# Patient Record
Sex: Male | Born: 1945
Health system: Southern US, Community
[De-identification: ages and names within clinical notes are randomized; demographics above are authoritative.]

## PROBLEM LIST (undated history)

## (undated) DIAGNOSIS — M4802 Spinal stenosis, cervical region: Secondary | ICD-10-CM

## (undated) DIAGNOSIS — C439 Malignant melanoma of skin, unspecified: Secondary | ICD-10-CM

## (undated) DIAGNOSIS — S93409A Sprain of unspecified ligament of unspecified ankle, initial encounter: Secondary | ICD-10-CM

## (undated) DIAGNOSIS — Z86010 Personal history of colonic polyps: Secondary | ICD-10-CM

## (undated) DIAGNOSIS — C189 Malignant neoplasm of colon, unspecified: Secondary | ICD-10-CM

## (undated) DIAGNOSIS — Z860101 Personal history of adenomatous and serrated colon polyps: Secondary | ICD-10-CM

## (undated) DIAGNOSIS — F419 Anxiety disorder, unspecified: Secondary | ICD-10-CM

## (undated) DIAGNOSIS — I10 Essential (primary) hypertension: Secondary | ICD-10-CM

## (undated) DIAGNOSIS — I4892 Unspecified atrial flutter: Secondary | ICD-10-CM

## (undated) DIAGNOSIS — E785 Hyperlipidemia, unspecified: Secondary | ICD-10-CM

## (undated) DIAGNOSIS — K219 Gastro-esophageal reflux disease without esophagitis: Secondary | ICD-10-CM

## (undated) DIAGNOSIS — H01009 Unspecified blepharitis unspecified eye, unspecified eyelid: Secondary | ICD-10-CM

## (undated) DIAGNOSIS — B029 Zoster without complications: Secondary | ICD-10-CM

## (undated) HISTORY — DX: Personal history of colonic polyps: Z86.010

## (undated) HISTORY — DX: Personal history of adenomatous and serrated colon polyps: Z86.0101

## (undated) HISTORY — DX: Anxiety disorder, unspecified: F41.9

## (undated) HISTORY — DX: Unspecified atrial flutter: I48.92

## (undated) HISTORY — DX: Sprain of unspecified ligament of unspecified ankle, initial encounter: S93.409A

## (undated) HISTORY — PX: APPENDECTOMY: SHX54

## (undated) HISTORY — PX: OTHER SURGICAL HISTORY: SHX169

## (undated) HISTORY — DX: Essential (primary) hypertension: I10

## (undated) HISTORY — DX: Malignant melanoma of skin, unspecified: C43.9

## (undated) HISTORY — DX: Malignant neoplasm of colon, unspecified: C18.9

## (undated) HISTORY — PX: ANKLE ARTHROSCOPY: SUR85

## (undated) HISTORY — DX: Zoster without complications: B02.9

## (undated) HISTORY — DX: Spinal stenosis, cervical region: M48.02

## (undated) HISTORY — PX: COLONOSCOPY: SHX174

## (undated) HISTORY — DX: Hyperlipidemia, unspecified: E78.5

## (undated) HISTORY — DX: Unspecified blepharitis unspecified eye, unspecified eyelid: H01.009

## (undated) HISTORY — PX: INGUINAL HERNIA REPAIR: SUR1180

## (undated) HISTORY — DX: Gastro-esophageal reflux disease without esophagitis: K21.9

---

## 1999-01-19 ENCOUNTER — Ambulatory Visit (HOSPITAL_BASED_OUTPATIENT_CLINIC_OR_DEPARTMENT_OTHER): Admission: RE | Admit: 1999-01-19 | Discharge: 1999-01-19 | Payer: Self-pay | Admitting: Orthopedic Surgery

## 2001-07-25 ENCOUNTER — Encounter: Payer: Self-pay | Admitting: Internal Medicine

## 2001-07-25 ENCOUNTER — Encounter: Admission: RE | Admit: 2001-07-25 | Discharge: 2001-07-25 | Payer: Self-pay | Admitting: Internal Medicine

## 2002-07-17 ENCOUNTER — Encounter: Admission: RE | Admit: 2002-07-17 | Discharge: 2002-07-17 | Payer: Self-pay | Admitting: Internal Medicine

## 2002-07-17 ENCOUNTER — Encounter: Payer: Self-pay | Admitting: Internal Medicine

## 2005-01-17 ENCOUNTER — Ambulatory Visit (HOSPITAL_COMMUNITY): Admission: RE | Admit: 2005-01-17 | Discharge: 2005-01-17 | Payer: Self-pay | Admitting: Gastroenterology

## 2005-02-06 ENCOUNTER — Ambulatory Visit: Payer: Self-pay | Admitting: Internal Medicine

## 2005-02-27 ENCOUNTER — Ambulatory Visit: Payer: Self-pay | Admitting: Internal Medicine

## 2005-03-05 ENCOUNTER — Ambulatory Visit: Payer: Self-pay | Admitting: Cardiology

## 2005-03-06 ENCOUNTER — Ambulatory Visit (HOSPITAL_COMMUNITY): Admission: RE | Admit: 2005-03-06 | Discharge: 2005-03-07 | Payer: Self-pay | Admitting: Internal Medicine

## 2005-03-06 ENCOUNTER — Ambulatory Visit: Payer: Self-pay | Admitting: Internal Medicine

## 2005-04-20 ENCOUNTER — Ambulatory Visit: Payer: Self-pay | Admitting: Internal Medicine

## 2010-12-06 ENCOUNTER — Other Ambulatory Visit: Payer: Self-pay | Admitting: Internal Medicine

## 2010-12-06 DIAGNOSIS — R070 Pain in throat: Secondary | ICD-10-CM

## 2010-12-07 ENCOUNTER — Ambulatory Visit
Admission: RE | Admit: 2010-12-07 | Discharge: 2010-12-07 | Disposition: A | Discharge status: Home / Self Care | Payer: BC Managed Care – PPO | Source: Ambulatory Visit | Attending: Internal Medicine | Admitting: Internal Medicine

## 2010-12-07 DIAGNOSIS — R070 Pain in throat: Secondary | ICD-10-CM

## 2010-12-07 MED ORDER — IOHEXOL 300 MG/ML  SOLN
75.0000 mL | Freq: Once | INTRAMUSCULAR | Status: AC | PRN
  Administered 2010-12-07: 75 mL via INTRAVENOUS

## 2011-11-27 ENCOUNTER — Other Ambulatory Visit: Payer: Self-pay | Admitting: Internal Medicine

## 2011-11-27 ENCOUNTER — Ambulatory Visit
Admission: RE | Admit: 2011-11-27 | Discharge: 2011-11-27 | Disposition: A | Discharge status: Home / Self Care | Payer: BC Managed Care – PPO | Source: Ambulatory Visit | Attending: Internal Medicine | Admitting: Internal Medicine

## 2011-11-27 DIAGNOSIS — S0990XA Unspecified injury of head, initial encounter: Secondary | ICD-10-CM

## 2012-06-26 DIAGNOSIS — G518 Other disorders of facial nerve: Secondary | ICD-10-CM | POA: Diagnosis not present

## 2012-07-07 DIAGNOSIS — R109 Unspecified abdominal pain: Secondary | ICD-10-CM | POA: Diagnosis not present

## 2012-09-24 DIAGNOSIS — F4324 Adjustment disorder with disturbance of conduct: Secondary | ICD-10-CM | POA: Diagnosis not present

## 2012-09-29 DIAGNOSIS — F4324 Adjustment disorder with disturbance of conduct: Secondary | ICD-10-CM | POA: Diagnosis not present

## 2012-10-01 DIAGNOSIS — B029 Zoster without complications: Secondary | ICD-10-CM | POA: Diagnosis not present

## 2012-10-02 DIAGNOSIS — B0239 Other herpes zoster eye disease: Secondary | ICD-10-CM | POA: Diagnosis not present

## 2012-10-10 DIAGNOSIS — B0239 Other herpes zoster eye disease: Secondary | ICD-10-CM | POA: Diagnosis not present

## 2012-10-10 DIAGNOSIS — H5210 Myopia, unspecified eye: Secondary | ICD-10-CM | POA: Diagnosis not present

## 2012-10-10 DIAGNOSIS — B029 Zoster without complications: Secondary | ICD-10-CM | POA: Diagnosis not present

## 2012-10-10 DIAGNOSIS — B0223 Postherpetic polyneuropathy: Secondary | ICD-10-CM | POA: Diagnosis not present

## 2012-10-16 DIAGNOSIS — Z85828 Personal history of other malignant neoplasm of skin: Secondary | ICD-10-CM | POA: Diagnosis not present

## 2012-10-16 DIAGNOSIS — B0223 Postherpetic polyneuropathy: Secondary | ICD-10-CM | POA: Diagnosis not present

## 2012-10-16 DIAGNOSIS — Z85820 Personal history of malignant melanoma of skin: Secondary | ICD-10-CM | POA: Diagnosis not present

## 2012-10-16 DIAGNOSIS — L570 Actinic keratosis: Secondary | ICD-10-CM | POA: Diagnosis not present

## 2012-10-20 DIAGNOSIS — F4324 Adjustment disorder with disturbance of conduct: Secondary | ICD-10-CM | POA: Diagnosis not present

## 2012-10-23 DIAGNOSIS — F4320 Adjustment disorder, unspecified: Secondary | ICD-10-CM | POA: Diagnosis not present

## 2012-10-23 DIAGNOSIS — F411 Generalized anxiety disorder: Secondary | ICD-10-CM | POA: Diagnosis not present

## 2012-10-24 DIAGNOSIS — B0223 Postherpetic polyneuropathy: Secondary | ICD-10-CM | POA: Diagnosis not present

## 2012-10-24 DIAGNOSIS — B0229 Other postherpetic nervous system involvement: Secondary | ICD-10-CM | POA: Diagnosis not present

## 2012-10-27 DIAGNOSIS — F4324 Adjustment disorder with disturbance of conduct: Secondary | ICD-10-CM | POA: Diagnosis not present

## 2012-11-04 DIAGNOSIS — F4324 Adjustment disorder with disturbance of conduct: Secondary | ICD-10-CM | POA: Diagnosis not present

## 2012-11-10 DIAGNOSIS — F4324 Adjustment disorder with disturbance of conduct: Secondary | ICD-10-CM | POA: Diagnosis not present

## 2012-11-10 DIAGNOSIS — F411 Generalized anxiety disorder: Secondary | ICD-10-CM | POA: Diagnosis not present

## 2012-11-12 DIAGNOSIS — F4324 Adjustment disorder with disturbance of conduct: Secondary | ICD-10-CM | POA: Diagnosis not present

## 2012-11-13 DIAGNOSIS — G518 Other disorders of facial nerve: Secondary | ICD-10-CM | POA: Diagnosis not present

## 2012-11-24 DIAGNOSIS — F411 Generalized anxiety disorder: Secondary | ICD-10-CM | POA: Diagnosis not present

## 2012-11-24 DIAGNOSIS — F4324 Adjustment disorder with disturbance of conduct: Secondary | ICD-10-CM | POA: Diagnosis not present

## 2012-12-15 DIAGNOSIS — F4324 Adjustment disorder with disturbance of conduct: Secondary | ICD-10-CM | POA: Diagnosis not present

## 2012-12-15 DIAGNOSIS — F411 Generalized anxiety disorder: Secondary | ICD-10-CM | POA: Diagnosis not present

## 2012-12-22 DIAGNOSIS — F4324 Adjustment disorder with disturbance of conduct: Secondary | ICD-10-CM | POA: Diagnosis not present

## 2012-12-22 DIAGNOSIS — F411 Generalized anxiety disorder: Secondary | ICD-10-CM | POA: Diagnosis not present

## 2013-01-14 DIAGNOSIS — F4324 Adjustment disorder with disturbance of conduct: Secondary | ICD-10-CM | POA: Diagnosis not present

## 2013-01-14 DIAGNOSIS — F411 Generalized anxiety disorder: Secondary | ICD-10-CM | POA: Diagnosis not present

## 2013-01-26 DIAGNOSIS — H6980 Other specified disorders of Eustachian tube, unspecified ear: Secondary | ICD-10-CM | POA: Diagnosis not present

## 2013-01-26 DIAGNOSIS — B0229 Other postherpetic nervous system involvement: Secondary | ICD-10-CM | POA: Diagnosis not present

## 2013-02-05 DIAGNOSIS — F411 Generalized anxiety disorder: Secondary | ICD-10-CM | POA: Diagnosis not present

## 2013-02-05 DIAGNOSIS — F4324 Adjustment disorder with disturbance of conduct: Secondary | ICD-10-CM | POA: Diagnosis not present

## 2013-02-27 DIAGNOSIS — G479 Sleep disorder, unspecified: Secondary | ICD-10-CM | POA: Diagnosis not present

## 2013-02-27 DIAGNOSIS — Z125 Encounter for screening for malignant neoplasm of prostate: Secondary | ICD-10-CM | POA: Diagnosis not present

## 2013-02-27 DIAGNOSIS — Z23 Encounter for immunization: Secondary | ICD-10-CM | POA: Diagnosis not present

## 2013-02-27 DIAGNOSIS — I4892 Unspecified atrial flutter: Secondary | ICD-10-CM | POA: Diagnosis not present

## 2013-02-27 DIAGNOSIS — K219 Gastro-esophageal reflux disease without esophagitis: Secondary | ICD-10-CM | POA: Diagnosis not present

## 2013-02-27 DIAGNOSIS — G5600 Carpal tunnel syndrome, unspecified upper limb: Secondary | ICD-10-CM | POA: Diagnosis not present

## 2013-02-27 DIAGNOSIS — Z0000 Encounter for general adult medical examination without abnormal findings: Secondary | ICD-10-CM | POA: Diagnosis not present

## 2013-02-27 DIAGNOSIS — F411 Generalized anxiety disorder: Secondary | ICD-10-CM | POA: Diagnosis not present

## 2013-02-27 DIAGNOSIS — I10 Essential (primary) hypertension: Secondary | ICD-10-CM | POA: Diagnosis not present

## 2013-03-12 DIAGNOSIS — R209 Unspecified disturbances of skin sensation: Secondary | ICD-10-CM | POA: Diagnosis not present

## 2013-03-12 DIAGNOSIS — G518 Other disorders of facial nerve: Secondary | ICD-10-CM | POA: Diagnosis not present

## 2013-03-24 DIAGNOSIS — H579 Unspecified disorder of eye and adnexa: Secondary | ICD-10-CM | POA: Diagnosis not present

## 2013-04-07 DIAGNOSIS — F4324 Adjustment disorder with disturbance of conduct: Secondary | ICD-10-CM | POA: Diagnosis not present

## 2013-04-07 DIAGNOSIS — F411 Generalized anxiety disorder: Secondary | ICD-10-CM | POA: Diagnosis not present

## 2013-04-16 DIAGNOSIS — H11429 Conjunctival edema, unspecified eye: Secondary | ICD-10-CM | POA: Diagnosis not present

## 2013-04-16 DIAGNOSIS — H01009 Unspecified blepharitis unspecified eye, unspecified eyelid: Secondary | ICD-10-CM | POA: Diagnosis not present

## 2013-05-01 DIAGNOSIS — F4324 Adjustment disorder with disturbance of conduct: Secondary | ICD-10-CM | POA: Diagnosis not present

## 2013-05-01 DIAGNOSIS — F411 Generalized anxiety disorder: Secondary | ICD-10-CM | POA: Diagnosis not present

## 2013-05-04 DIAGNOSIS — F4324 Adjustment disorder with disturbance of conduct: Secondary | ICD-10-CM | POA: Diagnosis not present

## 2013-05-04 DIAGNOSIS — N529 Male erectile dysfunction, unspecified: Secondary | ICD-10-CM | POA: Diagnosis not present

## 2013-05-04 DIAGNOSIS — N400 Enlarged prostate without lower urinary tract symptoms: Secondary | ICD-10-CM | POA: Diagnosis not present

## 2013-05-04 DIAGNOSIS — R972 Elevated prostate specific antigen [PSA]: Secondary | ICD-10-CM | POA: Diagnosis not present

## 2013-05-04 DIAGNOSIS — F411 Generalized anxiety disorder: Secondary | ICD-10-CM | POA: Diagnosis not present

## 2013-05-13 DIAGNOSIS — F4324 Adjustment disorder with disturbance of conduct: Secondary | ICD-10-CM | POA: Diagnosis not present

## 2013-05-13 DIAGNOSIS — F411 Generalized anxiety disorder: Secondary | ICD-10-CM | POA: Diagnosis not present

## 2013-05-13 DIAGNOSIS — H01009 Unspecified blepharitis unspecified eye, unspecified eyelid: Secondary | ICD-10-CM | POA: Diagnosis not present

## 2013-06-24 ENCOUNTER — Encounter: Payer: Self-pay | Admitting: *Deleted

## 2013-06-24 DIAGNOSIS — I10 Essential (primary) hypertension: Secondary | ICD-10-CM | POA: Insufficient documentation

## 2013-06-24 DIAGNOSIS — I4892 Unspecified atrial flutter: Secondary | ICD-10-CM | POA: Insufficient documentation

## 2013-06-30 DIAGNOSIS — F411 Generalized anxiety disorder: Secondary | ICD-10-CM | POA: Diagnosis not present

## 2013-06-30 DIAGNOSIS — F4324 Adjustment disorder with disturbance of conduct: Secondary | ICD-10-CM | POA: Diagnosis not present

## 2013-07-07 DIAGNOSIS — F988 Other specified behavioral and emotional disorders with onset usually occurring in childhood and adolescence: Secondary | ICD-10-CM | POA: Diagnosis not present

## 2013-07-07 DIAGNOSIS — F411 Generalized anxiety disorder: Secondary | ICD-10-CM | POA: Diagnosis not present

## 2013-07-09 DIAGNOSIS — D126 Benign neoplasm of colon, unspecified: Secondary | ICD-10-CM | POA: Diagnosis not present

## 2013-07-09 DIAGNOSIS — K5730 Diverticulosis of large intestine without perforation or abscess without bleeding: Secondary | ICD-10-CM | POA: Diagnosis not present

## 2013-07-09 DIAGNOSIS — Z1211 Encounter for screening for malignant neoplasm of colon: Secondary | ICD-10-CM | POA: Diagnosis not present

## 2013-07-16 DIAGNOSIS — G518 Other disorders of facial nerve: Secondary | ICD-10-CM | POA: Diagnosis not present

## 2013-08-05 DIAGNOSIS — F411 Generalized anxiety disorder: Secondary | ICD-10-CM | POA: Diagnosis not present

## 2013-08-05 DIAGNOSIS — F4324 Adjustment disorder with disturbance of conduct: Secondary | ICD-10-CM | POA: Diagnosis not present

## 2013-08-05 DIAGNOSIS — F988 Other specified behavioral and emotional disorders with onset usually occurring in childhood and adolescence: Secondary | ICD-10-CM | POA: Diagnosis not present

## 2013-08-18 DIAGNOSIS — K645 Perianal venous thrombosis: Secondary | ICD-10-CM | POA: Diagnosis not present

## 2013-08-18 DIAGNOSIS — I10 Essential (primary) hypertension: Secondary | ICD-10-CM | POA: Diagnosis not present

## 2013-08-25 DIAGNOSIS — F988 Other specified behavioral and emotional disorders with onset usually occurring in childhood and adolescence: Secondary | ICD-10-CM | POA: Diagnosis not present

## 2013-08-25 DIAGNOSIS — F4324 Adjustment disorder with disturbance of conduct: Secondary | ICD-10-CM | POA: Diagnosis not present

## 2013-08-25 DIAGNOSIS — F411 Generalized anxiety disorder: Secondary | ICD-10-CM | POA: Diagnosis not present

## 2013-10-22 DIAGNOSIS — Z87891 Personal history of nicotine dependence: Secondary | ICD-10-CM | POA: Diagnosis not present

## 2013-10-22 DIAGNOSIS — D485 Neoplasm of uncertain behavior of skin: Secondary | ICD-10-CM | POA: Diagnosis not present

## 2013-10-22 DIAGNOSIS — L821 Other seborrheic keratosis: Secondary | ICD-10-CM | POA: Diagnosis not present

## 2013-10-22 DIAGNOSIS — I10 Essential (primary) hypertension: Secondary | ICD-10-CM | POA: Diagnosis not present

## 2013-10-22 DIAGNOSIS — L578 Other skin changes due to chronic exposure to nonionizing radiation: Secondary | ICD-10-CM | POA: Diagnosis not present

## 2013-10-22 DIAGNOSIS — Z85820 Personal history of malignant melanoma of skin: Secondary | ICD-10-CM | POA: Diagnosis not present

## 2013-10-22 DIAGNOSIS — D1809 Hemangioma of other sites: Secondary | ICD-10-CM | POA: Diagnosis not present

## 2013-10-22 DIAGNOSIS — Z85828 Personal history of other malignant neoplasm of skin: Secondary | ICD-10-CM | POA: Diagnosis not present

## 2013-11-12 DIAGNOSIS — R002 Palpitations: Secondary | ICD-10-CM | POA: Diagnosis not present

## 2013-11-12 DIAGNOSIS — Z23 Encounter for immunization: Secondary | ICD-10-CM | POA: Diagnosis not present

## 2013-11-16 DIAGNOSIS — I10 Essential (primary) hypertension: Secondary | ICD-10-CM | POA: Diagnosis not present

## 2013-11-16 DIAGNOSIS — F419 Anxiety disorder, unspecified: Secondary | ICD-10-CM | POA: Diagnosis not present

## 2013-11-16 DIAGNOSIS — B0229 Other postherpetic nervous system involvement: Secondary | ICD-10-CM | POA: Diagnosis not present

## 2013-11-16 DIAGNOSIS — K219 Gastro-esophageal reflux disease without esophagitis: Secondary | ICD-10-CM | POA: Diagnosis not present

## 2014-01-12 DIAGNOSIS — F411 Generalized anxiety disorder: Secondary | ICD-10-CM | POA: Diagnosis not present

## 2014-01-12 DIAGNOSIS — F4324 Adjustment disorder with disturbance of conduct: Secondary | ICD-10-CM | POA: Diagnosis not present

## 2014-01-28 DIAGNOSIS — J0100 Acute maxillary sinusitis, unspecified: Secondary | ICD-10-CM | POA: Diagnosis not present

## 2014-01-28 DIAGNOSIS — Z7189 Other specified counseling: Secondary | ICD-10-CM | POA: Diagnosis not present

## 2014-02-09 DIAGNOSIS — F419 Anxiety disorder, unspecified: Secondary | ICD-10-CM | POA: Diagnosis not present

## 2014-02-09 DIAGNOSIS — K219 Gastro-esophageal reflux disease without esophagitis: Secondary | ICD-10-CM | POA: Diagnosis not present

## 2014-02-09 DIAGNOSIS — E559 Vitamin D deficiency, unspecified: Secondary | ICD-10-CM | POA: Diagnosis not present

## 2014-02-09 DIAGNOSIS — I10 Essential (primary) hypertension: Secondary | ICD-10-CM | POA: Diagnosis not present

## 2014-02-09 DIAGNOSIS — Z79899 Other long term (current) drug therapy: Secondary | ICD-10-CM | POA: Diagnosis not present

## 2014-02-09 DIAGNOSIS — Z0001 Encounter for general adult medical examination with abnormal findings: Secondary | ICD-10-CM | POA: Diagnosis not present

## 2014-02-09 DIAGNOSIS — B0229 Other postherpetic nervous system involvement: Secondary | ICD-10-CM | POA: Diagnosis not present

## 2014-02-09 DIAGNOSIS — J0100 Acute maxillary sinusitis, unspecified: Secondary | ICD-10-CM | POA: Diagnosis not present

## 2014-03-03 DIAGNOSIS — Z85828 Personal history of other malignant neoplasm of skin: Secondary | ICD-10-CM | POA: Diagnosis not present

## 2014-03-03 DIAGNOSIS — Z87891 Personal history of nicotine dependence: Secondary | ICD-10-CM | POA: Diagnosis not present

## 2014-03-03 DIAGNOSIS — Z85820 Personal history of malignant melanoma of skin: Secondary | ICD-10-CM | POA: Diagnosis not present

## 2014-03-03 DIAGNOSIS — L821 Other seborrheic keratosis: Secondary | ICD-10-CM | POA: Diagnosis not present

## 2014-03-03 DIAGNOSIS — I781 Nevus, non-neoplastic: Secondary | ICD-10-CM | POA: Diagnosis not present

## 2014-03-04 DIAGNOSIS — G518 Other disorders of facial nerve: Secondary | ICD-10-CM | POA: Diagnosis not present

## 2014-03-04 DIAGNOSIS — R29818 Other symptoms and signs involving the nervous system: Secondary | ICD-10-CM | POA: Diagnosis not present

## 2014-03-23 DIAGNOSIS — F411 Generalized anxiety disorder: Secondary | ICD-10-CM | POA: Diagnosis not present

## 2014-03-23 DIAGNOSIS — F4324 Adjustment disorder with disturbance of conduct: Secondary | ICD-10-CM | POA: Diagnosis not present

## 2014-04-14 DIAGNOSIS — Z7189 Other specified counseling: Secondary | ICD-10-CM | POA: Diagnosis not present

## 2014-04-14 DIAGNOSIS — F411 Generalized anxiety disorder: Secondary | ICD-10-CM | POA: Diagnosis not present

## 2014-04-14 DIAGNOSIS — F4324 Adjustment disorder with disturbance of conduct: Secondary | ICD-10-CM | POA: Diagnosis not present

## 2014-04-14 DIAGNOSIS — R972 Elevated prostate specific antigen [PSA]: Secondary | ICD-10-CM | POA: Diagnosis not present

## 2014-04-14 DIAGNOSIS — M109 Gout, unspecified: Secondary | ICD-10-CM | POA: Diagnosis not present

## 2014-04-15 DIAGNOSIS — F411 Generalized anxiety disorder: Secondary | ICD-10-CM | POA: Diagnosis not present

## 2014-04-15 DIAGNOSIS — F4324 Adjustment disorder with disturbance of conduct: Secondary | ICD-10-CM | POA: Diagnosis not present

## 2014-04-20 DIAGNOSIS — F4324 Adjustment disorder with disturbance of conduct: Secondary | ICD-10-CM | POA: Diagnosis not present

## 2014-05-20 DIAGNOSIS — A09 Infectious gastroenteritis and colitis, unspecified: Secondary | ICD-10-CM | POA: Diagnosis not present

## 2014-05-24 DIAGNOSIS — F4324 Adjustment disorder with disturbance of conduct: Secondary | ICD-10-CM | POA: Diagnosis not present

## 2014-05-25 DIAGNOSIS — N400 Enlarged prostate without lower urinary tract symptoms: Secondary | ICD-10-CM | POA: Diagnosis not present

## 2014-05-25 DIAGNOSIS — R972 Elevated prostate specific antigen [PSA]: Secondary | ICD-10-CM | POA: Diagnosis not present

## 2014-06-02 ENCOUNTER — Other Ambulatory Visit: Payer: Self-pay | Admitting: Gastroenterology

## 2014-06-10 DIAGNOSIS — F4324 Adjustment disorder with disturbance of conduct: Secondary | ICD-10-CM | POA: Diagnosis not present

## 2014-06-23 DIAGNOSIS — F4324 Adjustment disorder with disturbance of conduct: Secondary | ICD-10-CM | POA: Diagnosis not present

## 2014-07-20 DIAGNOSIS — F4324 Adjustment disorder with disturbance of conduct: Secondary | ICD-10-CM | POA: Diagnosis not present

## 2014-07-22 DIAGNOSIS — F4324 Adjustment disorder with disturbance of conduct: Secondary | ICD-10-CM | POA: Diagnosis not present

## 2014-07-27 DIAGNOSIS — F4324 Adjustment disorder with disturbance of conduct: Secondary | ICD-10-CM | POA: Diagnosis not present

## 2014-08-09 DIAGNOSIS — F4324 Adjustment disorder with disturbance of conduct: Secondary | ICD-10-CM | POA: Diagnosis not present

## 2014-08-11 ENCOUNTER — Encounter (HOSPITAL_COMMUNITY): Payer: Self-pay | Admitting: *Deleted

## 2014-08-17 ENCOUNTER — Encounter (HOSPITAL_COMMUNITY)
Admission: RE | Disposition: A | Discharge status: Home / Self Care | Payer: Self-pay | Source: Ambulatory Visit | Attending: Gastroenterology

## 2014-08-17 ENCOUNTER — Ambulatory Visit (HOSPITAL_COMMUNITY): Payer: Medicare Other | Admitting: Anesthesiology

## 2014-08-17 ENCOUNTER — Ambulatory Visit (HOSPITAL_COMMUNITY)
Admission: RE | Admit: 2014-08-17 | Discharge: 2014-08-17 | Disposition: A | Discharge status: Home / Self Care | Payer: Medicare Other | Source: Ambulatory Visit | Attending: Gastroenterology | Admitting: Gastroenterology

## 2014-08-17 ENCOUNTER — Encounter (HOSPITAL_COMMUNITY): Payer: Self-pay

## 2014-08-17 DIAGNOSIS — Z85820 Personal history of malignant melanoma of skin: Secondary | ICD-10-CM | POA: Insufficient documentation

## 2014-08-17 DIAGNOSIS — K635 Polyp of colon: Secondary | ICD-10-CM | POA: Diagnosis not present

## 2014-08-17 DIAGNOSIS — K5730 Diverticulosis of large intestine without perforation or abscess without bleeding: Secondary | ICD-10-CM | POA: Diagnosis not present

## 2014-08-17 DIAGNOSIS — M4802 Spinal stenosis, cervical region: Secondary | ICD-10-CM | POA: Diagnosis not present

## 2014-08-17 DIAGNOSIS — Z86010 Personal history of colonic polyps: Secondary | ICD-10-CM | POA: Insufficient documentation

## 2014-08-17 DIAGNOSIS — I10 Essential (primary) hypertension: Secondary | ICD-10-CM | POA: Insufficient documentation

## 2014-08-17 DIAGNOSIS — D120 Benign neoplasm of cecum: Secondary | ICD-10-CM | POA: Diagnosis not present

## 2014-08-17 DIAGNOSIS — D125 Benign neoplasm of sigmoid colon: Secondary | ICD-10-CM | POA: Insufficient documentation

## 2014-08-17 DIAGNOSIS — Z1211 Encounter for screening for malignant neoplasm of colon: Secondary | ICD-10-CM | POA: Diagnosis not present

## 2014-08-17 DIAGNOSIS — Z79899 Other long term (current) drug therapy: Secondary | ICD-10-CM | POA: Insufficient documentation

## 2014-08-17 DIAGNOSIS — K5790 Diverticulosis of intestine, part unspecified, without perforation or abscess without bleeding: Secondary | ICD-10-CM | POA: Diagnosis not present

## 2014-08-17 DIAGNOSIS — Z7982 Long term (current) use of aspirin: Secondary | ICD-10-CM | POA: Diagnosis not present

## 2014-08-17 DIAGNOSIS — N3010 Interstitial cystitis (chronic) without hematuria: Secondary | ICD-10-CM | POA: Insufficient documentation

## 2014-08-17 HISTORY — PX: COLONOSCOPY WITH PROPOFOL: SHX5780

## 2014-08-17 SURGERY — COLONOSCOPY WITH PROPOFOL
Anesthesia: Monitor Anesthesia Care

## 2014-08-17 MED ORDER — LIDOCAINE HCL (CARDIAC) 20 MG/ML IV SOLN
INTRAVENOUS | Status: AC
  Filled 2014-08-17: qty 5

## 2014-08-17 MED ORDER — PROPOFOL 10 MG/ML IV BOLUS
INTRAVENOUS | Status: AC
  Filled 2014-08-17: qty 20

## 2014-08-17 MED ORDER — PROPOFOL INFUSION 10 MG/ML OPTIME
INTRAVENOUS | Status: DC | PRN
  Administered 2014-08-17: 140 ug/kg/min via INTRAVENOUS

## 2014-08-17 MED ORDER — LACTATED RINGERS IV SOLN
INTRAVENOUS | Status: DC | PRN
  Administered 2014-08-17: 07:00:00 via INTRAVENOUS

## 2014-08-17 MED ORDER — SODIUM CHLORIDE 0.9 % IV SOLN: INTRAVENOUS | Status: DC

## 2014-08-17 MED ORDER — PROPOFOL 500 MG/50ML IV EMUL
INTRAVENOUS | Status: DC | PRN
  Administered 2014-08-17 (×2): 50 mg via INTRAVENOUS

## 2014-08-17 SURGICAL SUPPLY — 22 items

## 2014-08-17 NOTE — Anesthesia Preprocedure Evaluation (Addendum)
Anesthesia Evaluation  Patient identified by MRN, date of birth, ID band Patient awake    Reviewed: Allergy & Precautions, NPO status , Patient's Chart, lab work & pertinent test results  Airway Mallampati: II  TM Distance: >3 FB Neck ROM: Full    Dental no notable dental hx.    Pulmonary neg pulmonary ROS,  breath sounds clear to auscultation  Pulmonary exam normal       Cardiovascular hypertension, Pt. on medications Normal cardiovascular examRhythm:Regular Rate:Normal     Neuro/Psych negative neurological ROS  negative psych ROS   GI/Hepatic negative GI ROS, Neg liver ROS,   Endo/Other  negative endocrine ROS  Renal/GU negative Renal ROS  negative genitourinary   Musculoskeletal negative musculoskeletal ROS (+)   Abdominal   Peds negative pediatric ROS (+)  Hematology negative hematology ROS (+)   Anesthesia Other Findings   Reproductive/Obstetrics negative OB ROS                            Anesthesia Physical Anesthesia Plan  ASA: II  Anesthesia Plan: MAC   Post-op Pain Management:    Induction:   Airway Management Planned: Simple Face Mask  Additional Equipment:   Intra-op Plan:   Post-operative Plan:   Informed Consent: I have reviewed the patients History and Physical, chart, labs and discussed the procedure including the risks, benefits and alternatives for the proposed anesthesia with the patient or authorized representative who has indicated his/her understanding and acceptance.   Dental advisory given  Plan Discussed with: CRNA  Anesthesia Plan Comments:         Anesthesia Quick Evaluation

## 2014-08-17 NOTE — Anesthesia Postprocedure Evaluation (Signed)
  Anesthesia Post-op Note  Patient: Matthew Bailey  Procedure(s) Performed: Procedure(s) (LRB): COLONOSCOPY WITH PROPOFOL (N/A)  Patient Location: PACU  Anesthesia Type: MAC  Level of Consciousness: awake and alert   Airway and Oxygen Therapy: Patient Spontanous Breathing  Post-op Pain: mild  Post-op Assessment: Post-op Vital signs reviewed, Patient's Cardiovascular Status Stable, Respiratory Function Stable, Patent Airway and No signs of Nausea or vomiting  Last Vitals:  Filed Vitals:   08/17/14 0845  BP:   Pulse: 52  Temp:   Resp: 16    Post-op Vital Signs: stable   Complications: No apparent anesthesia complications

## 2014-08-17 NOTE — H&P (Signed)
  Procedure: Surveillance colonoscopy. 07/09/2013 colonoscopy performed with removal of multiple adenomatous colon polyps with a villous component  History: The patient is a 69 year old male born [birth year]. He is scheduled to undergo a surveillance colonoscopy today.  Past medical history: Cardiac ablation to treat atrial flutter. Hypertension. Cervical spinal stenosis. Interstitial cystitis syndrome. Melanoma surgery. Shingles diagnosed in September 2014. Inguinal hernia repair. Appendectomy. Knee arthroscopy.  Medication allergies: None  Exam: The patient is alert and lying comfortably on the endoscopy stretcher. Abdomen is soft and nontender to palpation. Lungs are clear to auscultation. Cardiac exam reveals a regular rhythm.  Plan: Proceed with surveillance colonoscopy

## 2014-08-17 NOTE — Discharge Instructions (Signed)
Colonoscopy A colonoscopy is an exam to look at the entire large intestine (colon). This exam can help find problems such as tumors, polyps, inflammation, and areas of bleeding. The exam takes about 1 hour.  LET [facility] CARE PROVIDER KNOW ABOUT:   Any allergies you have.  All medicines you are taking, including vitamins, herbs, eye drops, creams, and over-the-counter medicines.  Previous problems you or members of your family have had with the use of anesthetics.  Any blood disorders you have.  Previous surgeries you have had.  Medical conditions you have. RISKS AND COMPLICATIONS  Generally, this is a safe procedure. However, as with any procedure, complications can occur. Possible complications include:  Bleeding.  Tearing or rupture of the colon wall.  Reaction to medicines given during the exam.  Infection (rare). BEFORE THE PROCEDURE   Ask your health care provider about changing or stopping your regular medicines.  You may be prescribed an oral bowel prep. This involves drinking a large amount of medicated liquid, starting the day before your procedure. The liquid will cause you to have multiple loose stools until your stool is almost clear or light green. This cleans out your colon in preparation for the procedure.  Do not eat or drink anything else once you have started the bowel prep, unless your health care provider tells you it is safe to do so.  Arrange for someone to drive you home after the procedure. PROCEDURE   You will be given medicine to help you relax (sedative).  You will lie on your side with your knees bent.  A long, flexible tube with a light and camera on the end (colonoscope) will be inserted through the rectum and into the colon. The camera sends video back to a computer screen as it moves through the colon. The colonoscope also releases carbon dioxide gas to inflate the colon. This helps your health care provider see the area better.  During  the exam, your health care provider may take a small tissue sample (biopsy) to be examined under a microscope if any abnormalities are found.  The exam is finished when the entire colon has been viewed. AFTER THE PROCEDURE   Do not drive for 24 hours after the exam.  You may have a small amount of blood in your stool.  You may pass moderate amounts of gas and have mild abdominal cramping or bloating. This is caused by the gas used to inflate your colon during the exam.  Ask when your test results will be ready and how you will get your results. Make sure you get your test results. Document Released: 12/16/1999 Document Revised: 10/08/2012 Document Reviewed: 08/25/2012 [facility] Patient Information 2015 [place], [place]. This information is not intended to replace advice given to you by your health care provider. Make sure you discuss any questions you have with your health care provider.   Conscious Sedation, Adult, Care After Refer to this sheet in the next few weeks. These instructions provide you with information on caring for yourself after your procedure. Your health care provider may also give you more specific instructions. Your treatment has been planned according to current medical practices, but problems sometimes occur. Call your health care provider if you have any problems or questions after your procedure. WHAT TO EXPECT AFTER THE PROCEDURE  After your procedure:  You may feel sleepy, clumsy, and have poor balance for several hours.  Vomiting may occur if you eat too soon after the procedure. HOME CARE INSTRUCTIONS  Do not participate in any activities where you could become injured for at least 24 hours. Do not:  Drive.  Swim.  Ride a bicycle.  Operate heavy machinery.  [place].  Use power tools.  Climb ladders.  Work from a high place.  Do not make important decisions or sign legal documents until you are improved.  If you vomit, drink water, juice, or soup  when you can drink without vomiting. Make sure you have little or no nausea before eating solid foods.  Only take over-the-counter or prescription medicines for pain, discomfort, or fever as directed by your health care provider.  Make sure you and your family fully understand everything about the medicines given to you, including what side effects may occur.  You should not drink alcohol, take sleeping pills, or take medicines that cause drowsiness for at least 24 hours.  If you smoke, do not smoke without supervision.  If you are feeling better, you may resume normal activities 24 hours after you were sedated.  Keep all appointments with your health care provider. SEEK MEDICAL CARE IF:  Your skin is pale or bluish in color.  You continue to feel nauseous or vomit.  Your pain is getting worse and is not helped by medicine.  You have bleeding or swelling.  You are still sleepy or feeling clumsy after 24 hours. SEEK IMMEDIATE MEDICAL CARE IF:  You develop a rash.  You have difficulty breathing.  You develop any type of allergic problem.  You have a fever. MAKE SURE YOU:  Understand these instructions.  Will watch your condition.  Will get help right away if you are not doing well or get worse. Document Released: 10/08/2012 Document Reviewed: 10/08/2012 [facility] Patient Information 2015 [place], [place]. This information is not intended to replace advice given to you by your health care provider. Make sure you discuss any questions you have with your health care provider.

## 2014-08-17 NOTE — Transfer of Care (Signed)
Immediate Anesthesia Transfer of Care Note  Patient: Matthew Bailey  Procedure(s) Performed: Procedure(s): COLONOSCOPY WITH PROPOFOL (N/A)  Patient Location: PACU  Anesthesia Type:MAC  Level of Consciousness: awake, alert  and oriented  Airway & Oxygen Therapy: Patient Spontanous Breathing and Patient connected to face mask oxygen  Post-op Assessment: Report given to RN and Post -op Vital signs reviewed and stable  Post vital signs: Reviewed and stable  Last Vitals:  Filed Vitals:   08/17/14 0625  BP: 178/107  Pulse: 60  Temp: 36.5 C  Resp: 19    Complications: No apparent anesthesia complications

## 2014-08-17 NOTE — Op Note (Signed)
Procedure: Surveillance colonoscopy  Endoscopist: Earle Gell  Premedication: Propofol administered by anesthesia  Procedure: The patient was placed in the left lateral decubitus position. Anal inspection and digital rectal exam were normal. The Pentax pediatric colonoscope was introduced into the rectum and advanced to the cecum. A normal-appearing appendiceal orifice and ileocecal valve were identified. Colonic preparation for the exam today was good. Withdrawal time was 27 minutes  Rectum. Normal. Retroflexed view of the distal rectum was normal  Sigmoid colon. Diverticulosis. From the proximal sigmoid colon, a 3 mm sessile polyp was removed with the cold biopsy forceps  Descending colon. Diverticulosis  Splenic flexure. Normal  Transverse colon. Normal  Hepatic flexure. Normal  Ascending colon. Normal  Cecum and ileocecal valve. A 7 mm sessile polyp was removed from the proximal cecum with the cold snare and cold biopsy forceps.  Assessment: A diminutive polyp was removed from the sigmoid colon and a small polyp was removed from the cecum. Otherwise normal surveillance colonoscopy.  Recommendation: Schedule repeat surveillance colonoscopy in 5 years

## 2014-08-18 ENCOUNTER — Encounter (HOSPITAL_COMMUNITY): Payer: Self-pay | Admitting: Gastroenterology

## 2014-10-14 DIAGNOSIS — G513 Clonic hemifacial spasm: Secondary | ICD-10-CM | POA: Diagnosis not present

## 2014-10-15 DIAGNOSIS — H2513 Age-related nuclear cataract, bilateral: Secondary | ICD-10-CM | POA: Diagnosis not present

## 2014-10-15 DIAGNOSIS — H524 Presbyopia: Secondary | ICD-10-CM | POA: Diagnosis not present

## 2014-10-15 DIAGNOSIS — H52203 Unspecified astigmatism, bilateral: Secondary | ICD-10-CM | POA: Diagnosis not present

## 2014-10-15 DIAGNOSIS — H0289 Other specified disorders of eyelid: Secondary | ICD-10-CM | POA: Diagnosis not present

## 2014-10-15 DIAGNOSIS — H5213 Myopia, bilateral: Secondary | ICD-10-CM | POA: Diagnosis not present

## 2014-10-25 DIAGNOSIS — F4324 Adjustment disorder with disturbance of conduct: Secondary | ICD-10-CM | POA: Diagnosis not present

## 2014-11-11 DIAGNOSIS — F4324 Adjustment disorder with disturbance of conduct: Secondary | ICD-10-CM | POA: Diagnosis not present

## 2014-11-12 DIAGNOSIS — Z23 Encounter for immunization: Secondary | ICD-10-CM | POA: Diagnosis not present

## 2014-11-15 DIAGNOSIS — I491 Atrial premature depolarization: Secondary | ICD-10-CM | POA: Diagnosis not present

## 2014-11-15 DIAGNOSIS — J329 Chronic sinusitis, unspecified: Secondary | ICD-10-CM | POA: Diagnosis not present

## 2015-01-18 DIAGNOSIS — D229 Melanocytic nevi, unspecified: Secondary | ICD-10-CM | POA: Diagnosis not present

## 2015-01-18 DIAGNOSIS — Z85828 Personal history of other malignant neoplasm of skin: Secondary | ICD-10-CM | POA: Diagnosis not present

## 2015-01-18 DIAGNOSIS — Z85820 Personal history of malignant melanoma of skin: Secondary | ICD-10-CM | POA: Diagnosis not present

## 2015-02-09 DIAGNOSIS — K219 Gastro-esophageal reflux disease without esophagitis: Secondary | ICD-10-CM | POA: Diagnosis not present

## 2015-02-09 DIAGNOSIS — R109 Unspecified abdominal pain: Secondary | ICD-10-CM | POA: Diagnosis not present

## 2015-02-15 ENCOUNTER — Other Ambulatory Visit: Payer: Self-pay | Admitting: Internal Medicine

## 2015-02-15 DIAGNOSIS — K219 Gastro-esophageal reflux disease without esophagitis: Secondary | ICD-10-CM

## 2015-02-18 ENCOUNTER — Ambulatory Visit
Admission: RE | Admit: 2015-02-18 | Discharge: 2015-02-18 | Disposition: A | Discharge status: Home / Self Care | Payer: Medicare Other | Source: Ambulatory Visit | Attending: Internal Medicine | Admitting: Internal Medicine

## 2015-02-18 DIAGNOSIS — K219 Gastro-esophageal reflux disease without esophagitis: Secondary | ICD-10-CM

## 2015-05-16 DIAGNOSIS — Z23 Encounter for immunization: Secondary | ICD-10-CM | POA: Diagnosis not present

## 2015-05-16 DIAGNOSIS — E559 Vitamin D deficiency, unspecified: Secondary | ICD-10-CM | POA: Diagnosis not present

## 2015-05-16 DIAGNOSIS — Z0000 Encounter for general adult medical examination without abnormal findings: Secondary | ICD-10-CM | POA: Diagnosis not present

## 2015-05-16 DIAGNOSIS — Z79899 Other long term (current) drug therapy: Secondary | ICD-10-CM | POA: Diagnosis not present

## 2015-05-16 DIAGNOSIS — Z1389 Encounter for screening for other disorder: Secondary | ICD-10-CM | POA: Diagnosis not present

## 2015-05-16 DIAGNOSIS — Z86010 Personal history of colonic polyps: Secondary | ICD-10-CM | POA: Diagnosis not present

## 2015-05-16 DIAGNOSIS — F4324 Adjustment disorder with disturbance of conduct: Secondary | ICD-10-CM | POA: Diagnosis not present

## 2015-05-16 DIAGNOSIS — F419 Anxiety disorder, unspecified: Secondary | ICD-10-CM | POA: Diagnosis not present

## 2015-05-16 DIAGNOSIS — D696 Thrombocytopenia, unspecified: Secondary | ICD-10-CM | POA: Diagnosis not present

## 2015-05-16 DIAGNOSIS — I491 Atrial premature depolarization: Secondary | ICD-10-CM | POA: Diagnosis not present

## 2015-05-16 DIAGNOSIS — K219 Gastro-esophageal reflux disease without esophagitis: Secondary | ICD-10-CM | POA: Diagnosis not present

## 2015-05-16 DIAGNOSIS — R972 Elevated prostate specific antigen [PSA]: Secondary | ICD-10-CM | POA: Diagnosis not present

## 2015-05-16 DIAGNOSIS — I10 Essential (primary) hypertension: Secondary | ICD-10-CM | POA: Diagnosis not present

## 2015-05-16 DIAGNOSIS — M109 Gout, unspecified: Secondary | ICD-10-CM | POA: Diagnosis not present

## 2015-06-02 DIAGNOSIS — N5201 Erectile dysfunction due to arterial insufficiency: Secondary | ICD-10-CM | POA: Diagnosis not present

## 2015-06-02 DIAGNOSIS — R972 Elevated prostate specific antigen [PSA]: Secondary | ICD-10-CM | POA: Diagnosis not present

## 2015-06-03 DIAGNOSIS — F4324 Adjustment disorder with disturbance of conduct: Secondary | ICD-10-CM | POA: Diagnosis not present

## 2015-06-16 DIAGNOSIS — Z09 Encounter for follow-up examination after completed treatment for conditions other than malignant neoplasm: Secondary | ICD-10-CM | POA: Diagnosis not present

## 2015-06-16 DIAGNOSIS — G513 Clonic hemifacial spasm: Secondary | ICD-10-CM | POA: Diagnosis not present

## 2015-07-12 DIAGNOSIS — R42 Dizziness and giddiness: Secondary | ICD-10-CM | POA: Diagnosis not present

## 2015-07-20 DIAGNOSIS — F4324 Adjustment disorder with disturbance of conduct: Secondary | ICD-10-CM | POA: Diagnosis not present

## 2015-07-26 DIAGNOSIS — H8190 Unspecified disorder of vestibular function, unspecified ear: Secondary | ICD-10-CM | POA: Diagnosis not present

## 2015-07-26 DIAGNOSIS — R42 Dizziness and giddiness: Secondary | ICD-10-CM | POA: Diagnosis not present

## 2015-07-27 DIAGNOSIS — F4324 Adjustment disorder with disturbance of conduct: Secondary | ICD-10-CM | POA: Diagnosis not present

## 2015-08-02 DIAGNOSIS — F4324 Adjustment disorder with disturbance of conduct: Secondary | ICD-10-CM | POA: Diagnosis not present

## 2015-09-19 DIAGNOSIS — F4324 Adjustment disorder with disturbance of conduct: Secondary | ICD-10-CM | POA: Diagnosis not present

## 2015-11-02 DIAGNOSIS — Z23 Encounter for immunization: Secondary | ICD-10-CM | POA: Diagnosis not present

## 2015-11-22 DIAGNOSIS — J208 Acute bronchitis due to other specified organisms: Secondary | ICD-10-CM | POA: Diagnosis not present

## 2015-11-22 DIAGNOSIS — F4324 Adjustment disorder with disturbance of conduct: Secondary | ICD-10-CM | POA: Diagnosis not present

## 2015-11-24 DIAGNOSIS — H109 Unspecified conjunctivitis: Secondary | ICD-10-CM | POA: Diagnosis not present

## 2015-12-19 DIAGNOSIS — R972 Elevated prostate specific antigen [PSA]: Secondary | ICD-10-CM | POA: Diagnosis not present

## 2015-12-21 DIAGNOSIS — F4324 Adjustment disorder with disturbance of conduct: Secondary | ICD-10-CM | POA: Diagnosis not present

## 2016-01-13 DIAGNOSIS — F4324 Adjustment disorder with disturbance of conduct: Secondary | ICD-10-CM | POA: Diagnosis not present

## 2016-02-08 DIAGNOSIS — F4324 Adjustment disorder with disturbance of conduct: Secondary | ICD-10-CM | POA: Diagnosis not present

## 2016-02-20 DIAGNOSIS — Z85820 Personal history of malignant melanoma of skin: Secondary | ICD-10-CM | POA: Diagnosis not present

## 2016-02-20 DIAGNOSIS — L821 Other seborrheic keratosis: Secondary | ICD-10-CM | POA: Diagnosis not present

## 2016-03-16 DIAGNOSIS — J209 Acute bronchitis, unspecified: Secondary | ICD-10-CM | POA: Diagnosis not present

## 2016-05-24 DIAGNOSIS — R109 Unspecified abdominal pain: Secondary | ICD-10-CM | POA: Diagnosis not present

## 2016-05-24 DIAGNOSIS — M549 Dorsalgia, unspecified: Secondary | ICD-10-CM | POA: Diagnosis not present

## 2016-05-25 DIAGNOSIS — F4324 Adjustment disorder with disturbance of conduct: Secondary | ICD-10-CM | POA: Diagnosis not present

## 2016-06-07 DIAGNOSIS — M109 Gout, unspecified: Secondary | ICD-10-CM | POA: Diagnosis not present

## 2016-06-20 DIAGNOSIS — F419 Anxiety disorder, unspecified: Secondary | ICD-10-CM | POA: Diagnosis not present

## 2016-06-20 DIAGNOSIS — K219 Gastro-esophageal reflux disease without esophagitis: Secondary | ICD-10-CM | POA: Diagnosis not present

## 2016-06-20 DIAGNOSIS — Z0001 Encounter for general adult medical examination with abnormal findings: Secondary | ICD-10-CM | POA: Diagnosis not present

## 2016-06-20 DIAGNOSIS — M109 Gout, unspecified: Secondary | ICD-10-CM | POA: Diagnosis not present

## 2016-06-20 DIAGNOSIS — D696 Thrombocytopenia, unspecified: Secondary | ICD-10-CM | POA: Diagnosis not present

## 2016-06-20 DIAGNOSIS — R972 Elevated prostate specific antigen [PSA]: Secondary | ICD-10-CM | POA: Diagnosis not present

## 2016-06-20 DIAGNOSIS — Z1389 Encounter for screening for other disorder: Secondary | ICD-10-CM | POA: Diagnosis not present

## 2016-06-20 DIAGNOSIS — I10 Essential (primary) hypertension: Secondary | ICD-10-CM | POA: Diagnosis not present

## 2016-06-20 DIAGNOSIS — N529 Male erectile dysfunction, unspecified: Secondary | ICD-10-CM | POA: Diagnosis not present

## 2016-06-20 DIAGNOSIS — E559 Vitamin D deficiency, unspecified: Secondary | ICD-10-CM | POA: Diagnosis not present

## 2016-06-20 DIAGNOSIS — Z79899 Other long term (current) drug therapy: Secondary | ICD-10-CM | POA: Diagnosis not present

## 2016-07-10 DIAGNOSIS — R972 Elevated prostate specific antigen [PSA]: Secondary | ICD-10-CM | POA: Diagnosis not present

## 2016-07-10 DIAGNOSIS — N400 Enlarged prostate without lower urinary tract symptoms: Secondary | ICD-10-CM | POA: Diagnosis not present

## 2016-07-13 DIAGNOSIS — F4324 Adjustment disorder with disturbance of conduct: Secondary | ICD-10-CM | POA: Diagnosis not present

## 2016-07-19 DIAGNOSIS — F4324 Adjustment disorder with disturbance of conduct: Secondary | ICD-10-CM | POA: Diagnosis not present

## 2016-08-09 DIAGNOSIS — G513 Clonic hemifacial spasm: Secondary | ICD-10-CM | POA: Diagnosis not present

## 2016-08-09 DIAGNOSIS — Z09 Encounter for follow-up examination after completed treatment for conditions other than malignant neoplasm: Secondary | ICD-10-CM | POA: Diagnosis not present

## 2016-08-16 DIAGNOSIS — S39011A Strain of muscle, fascia and tendon of abdomen, initial encounter: Secondary | ICD-10-CM | POA: Diagnosis not present

## 2016-08-16 DIAGNOSIS — F4324 Adjustment disorder with disturbance of conduct: Secondary | ICD-10-CM | POA: Diagnosis not present

## 2016-08-17 DIAGNOSIS — H5213 Myopia, bilateral: Secondary | ICD-10-CM | POA: Diagnosis not present

## 2016-08-17 DIAGNOSIS — H2513 Age-related nuclear cataract, bilateral: Secondary | ICD-10-CM | POA: Diagnosis not present

## 2016-09-21 DIAGNOSIS — F4324 Adjustment disorder with disturbance of conduct: Secondary | ICD-10-CM | POA: Diagnosis not present

## 2016-09-28 DIAGNOSIS — J310 Chronic rhinitis: Secondary | ICD-10-CM | POA: Diagnosis not present

## 2016-09-28 DIAGNOSIS — Z23 Encounter for immunization: Secondary | ICD-10-CM | POA: Diagnosis not present

## 2016-10-23 DIAGNOSIS — R972 Elevated prostate specific antigen [PSA]: Secondary | ICD-10-CM | POA: Diagnosis not present

## 2016-11-06 DIAGNOSIS — R972 Elevated prostate specific antigen [PSA]: Secondary | ICD-10-CM | POA: Diagnosis not present

## 2016-11-06 DIAGNOSIS — N400 Enlarged prostate without lower urinary tract symptoms: Secondary | ICD-10-CM | POA: Diagnosis not present

## 2016-11-30 DIAGNOSIS — I10 Essential (primary) hypertension: Secondary | ICD-10-CM | POA: Diagnosis not present

## 2016-11-30 DIAGNOSIS — E785 Hyperlipidemia, unspecified: Secondary | ICD-10-CM | POA: Diagnosis not present

## 2016-12-05 DIAGNOSIS — I10 Essential (primary) hypertension: Secondary | ICD-10-CM | POA: Diagnosis not present

## 2016-12-05 DIAGNOSIS — E785 Hyperlipidemia, unspecified: Secondary | ICD-10-CM | POA: Diagnosis not present

## 2016-12-19 DIAGNOSIS — C61 Malignant neoplasm of prostate: Secondary | ICD-10-CM | POA: Diagnosis not present

## 2016-12-19 DIAGNOSIS — D075 Carcinoma in situ of prostate: Secondary | ICD-10-CM | POA: Diagnosis not present

## 2016-12-19 DIAGNOSIS — R972 Elevated prostate specific antigen [PSA]: Secondary | ICD-10-CM | POA: Diagnosis not present

## 2017-01-03 DIAGNOSIS — C61 Malignant neoplasm of prostate: Secondary | ICD-10-CM | POA: Diagnosis not present

## 2017-01-07 DIAGNOSIS — F4324 Adjustment disorder with disturbance of conduct: Secondary | ICD-10-CM | POA: Diagnosis not present

## 2017-02-25 DIAGNOSIS — G5702 Lesion of sciatic nerve, left lower limb: Secondary | ICD-10-CM | POA: Diagnosis not present

## 2017-03-04 DIAGNOSIS — F411 Generalized anxiety disorder: Secondary | ICD-10-CM | POA: Diagnosis not present

## 2017-03-08 DIAGNOSIS — I10 Essential (primary) hypertension: Secondary | ICD-10-CM | POA: Diagnosis not present

## 2017-03-08 DIAGNOSIS — E785 Hyperlipidemia, unspecified: Secondary | ICD-10-CM | POA: Diagnosis not present

## 2017-03-14 DIAGNOSIS — L814 Other melanin hyperpigmentation: Secondary | ICD-10-CM | POA: Diagnosis not present

## 2017-03-14 DIAGNOSIS — Z85828 Personal history of other malignant neoplasm of skin: Secondary | ICD-10-CM | POA: Diagnosis not present

## 2017-03-14 DIAGNOSIS — L821 Other seborrheic keratosis: Secondary | ICD-10-CM | POA: Diagnosis not present

## 2017-03-14 DIAGNOSIS — D1801 Hemangioma of skin and subcutaneous tissue: Secondary | ICD-10-CM | POA: Diagnosis not present

## 2017-03-14 DIAGNOSIS — Z85820 Personal history of malignant melanoma of skin: Secondary | ICD-10-CM | POA: Diagnosis not present

## 2017-04-08 DIAGNOSIS — F411 Generalized anxiety disorder: Secondary | ICD-10-CM | POA: Diagnosis not present

## 2017-05-13 DIAGNOSIS — F411 Generalized anxiety disorder: Secondary | ICD-10-CM | POA: Diagnosis not present

## 2017-06-17 DIAGNOSIS — F411 Generalized anxiety disorder: Secondary | ICD-10-CM | POA: Diagnosis not present

## 2017-06-25 DIAGNOSIS — M542 Cervicalgia: Secondary | ICD-10-CM | POA: Diagnosis not present

## 2017-06-26 DIAGNOSIS — C61 Malignant neoplasm of prostate: Secondary | ICD-10-CM | POA: Diagnosis not present

## 2017-07-02 DIAGNOSIS — R6882 Decreased libido: Secondary | ICD-10-CM | POA: Diagnosis not present

## 2017-07-02 DIAGNOSIS — C61 Malignant neoplasm of prostate: Secondary | ICD-10-CM | POA: Diagnosis not present

## 2017-07-02 DIAGNOSIS — I10 Essential (primary) hypertension: Secondary | ICD-10-CM | POA: Diagnosis not present

## 2017-07-02 DIAGNOSIS — I889 Nonspecific lymphadenitis, unspecified: Secondary | ICD-10-CM | POA: Diagnosis not present

## 2017-07-15 DIAGNOSIS — F411 Generalized anxiety disorder: Secondary | ICD-10-CM | POA: Diagnosis not present

## 2017-07-19 DIAGNOSIS — F419 Anxiety disorder, unspecified: Secondary | ICD-10-CM | POA: Diagnosis not present

## 2017-07-19 DIAGNOSIS — I10 Essential (primary) hypertension: Secondary | ICD-10-CM | POA: Diagnosis not present

## 2017-07-25 DIAGNOSIS — F419 Anxiety disorder, unspecified: Secondary | ICD-10-CM | POA: Diagnosis not present

## 2017-07-25 DIAGNOSIS — I10 Essential (primary) hypertension: Secondary | ICD-10-CM | POA: Diagnosis not present

## 2017-07-30 DIAGNOSIS — E785 Hyperlipidemia, unspecified: Secondary | ICD-10-CM | POA: Diagnosis not present

## 2017-07-30 DIAGNOSIS — I10 Essential (primary) hypertension: Secondary | ICD-10-CM | POA: Diagnosis not present

## 2017-08-12 DIAGNOSIS — F411 Generalized anxiety disorder: Secondary | ICD-10-CM | POA: Diagnosis not present

## 2017-08-13 DIAGNOSIS — E785 Hyperlipidemia, unspecified: Secondary | ICD-10-CM | POA: Diagnosis not present

## 2017-08-13 DIAGNOSIS — C61 Malignant neoplasm of prostate: Secondary | ICD-10-CM | POA: Diagnosis not present

## 2017-08-13 DIAGNOSIS — I10 Essential (primary) hypertension: Secondary | ICD-10-CM | POA: Diagnosis not present

## 2017-08-15 DIAGNOSIS — H5213 Myopia, bilateral: Secondary | ICD-10-CM | POA: Diagnosis not present

## 2017-08-15 DIAGNOSIS — H2513 Age-related nuclear cataract, bilateral: Secondary | ICD-10-CM | POA: Diagnosis not present

## 2017-08-27 DIAGNOSIS — I10 Essential (primary) hypertension: Secondary | ICD-10-CM | POA: Diagnosis not present

## 2017-08-27 DIAGNOSIS — Z86010 Personal history of colonic polyps: Secondary | ICD-10-CM | POA: Diagnosis not present

## 2017-08-27 DIAGNOSIS — Z79899 Other long term (current) drug therapy: Secondary | ICD-10-CM | POA: Diagnosis not present

## 2017-08-27 DIAGNOSIS — E785 Hyperlipidemia, unspecified: Secondary | ICD-10-CM | POA: Diagnosis not present

## 2017-08-27 DIAGNOSIS — K219 Gastro-esophageal reflux disease without esophagitis: Secondary | ICD-10-CM | POA: Diagnosis not present

## 2017-08-27 DIAGNOSIS — Z1389 Encounter for screening for other disorder: Secondary | ICD-10-CM | POA: Diagnosis not present

## 2017-08-27 DIAGNOSIS — R140 Abdominal distension (gaseous): Secondary | ICD-10-CM | POA: Diagnosis not present

## 2017-08-27 DIAGNOSIS — Z0000 Encounter for general adult medical examination without abnormal findings: Secondary | ICD-10-CM | POA: Diagnosis not present

## 2017-08-27 DIAGNOSIS — F419 Anxiety disorder, unspecified: Secondary | ICD-10-CM | POA: Diagnosis not present

## 2017-08-27 DIAGNOSIS — M109 Gout, unspecified: Secondary | ICD-10-CM | POA: Diagnosis not present

## 2017-08-27 DIAGNOSIS — D696 Thrombocytopenia, unspecified: Secondary | ICD-10-CM | POA: Diagnosis not present

## 2017-08-27 DIAGNOSIS — E559 Vitamin D deficiency, unspecified: Secondary | ICD-10-CM | POA: Diagnosis not present

## 2017-08-27 DIAGNOSIS — C61 Malignant neoplasm of prostate: Secondary | ICD-10-CM | POA: Diagnosis not present

## 2017-08-27 DIAGNOSIS — N529 Male erectile dysfunction, unspecified: Secondary | ICD-10-CM | POA: Diagnosis not present

## 2017-09-09 DIAGNOSIS — I10 Essential (primary) hypertension: Secondary | ICD-10-CM | POA: Diagnosis not present

## 2017-09-09 DIAGNOSIS — E785 Hyperlipidemia, unspecified: Secondary | ICD-10-CM | POA: Diagnosis not present

## 2017-09-09 DIAGNOSIS — C61 Malignant neoplasm of prostate: Secondary | ICD-10-CM | POA: Diagnosis not present

## 2017-09-10 DIAGNOSIS — C61 Malignant neoplasm of prostate: Secondary | ICD-10-CM | POA: Diagnosis not present

## 2017-09-10 DIAGNOSIS — N401 Enlarged prostate with lower urinary tract symptoms: Secondary | ICD-10-CM | POA: Diagnosis not present

## 2017-09-10 DIAGNOSIS — R351 Nocturia: Secondary | ICD-10-CM | POA: Diagnosis not present

## 2017-09-10 IMAGING — RF DG UGI W/ HIGH DENSITY W/KUB
19 of 24 series · 19 of 24 positions shown · non-contrast
Comparison: Report of CT Abdomen and Pelvis 07/17/2002 (no images
available).

CLINICAL DATA: 69-year-old male with gastroesophageal reflux
disease, esophagitis. Intermittent epigastric pain. Initial
encounter.

EXAM:
UPPER GI SERIES WITH KUB
TECHNIQUE: After obtaining a scout radiograph a routine upper GI series was
performed using effervescent crystals and barium
FLUOROSCOPY TIME:  Radiation Exposure Index (as provided by the
fluoroscopic device): 103 d Gy cm2
If the device does not provide the exposure index:
Fluoroscopy Time (in minutes and seconds):  3 minutes 30 seconds

[Series 1: run · 1 of 1 slices shown (1 of 19)]
[im 1/1]
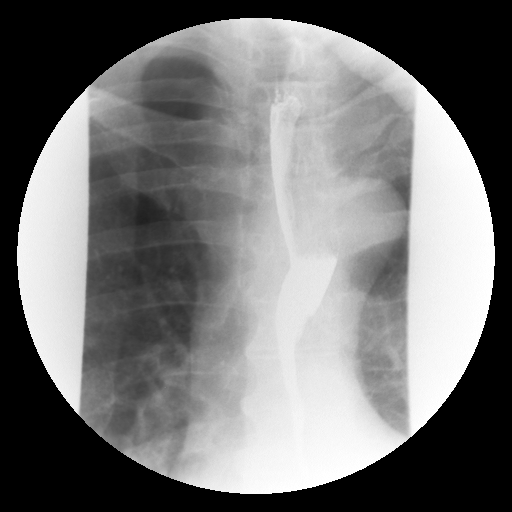

[Series 2: run · 1 of 1 slices shown (2 of 19)]
[im 1/1]
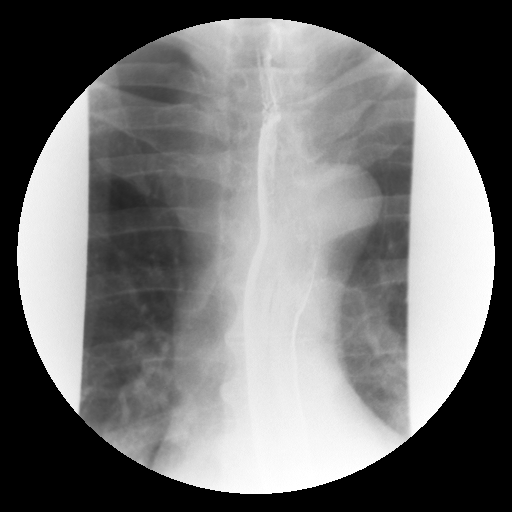

[Series 4: run · 1 of 1 slices shown (3 of 19)]
[im 1/1]
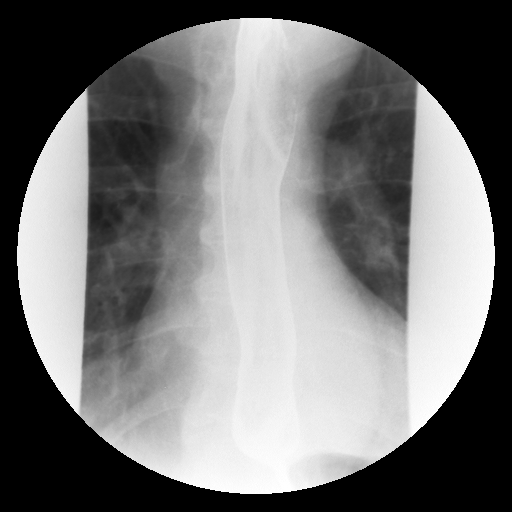

[Series 5: run · 1 of 1 slices shown (4 of 19)]
[im 1/1]
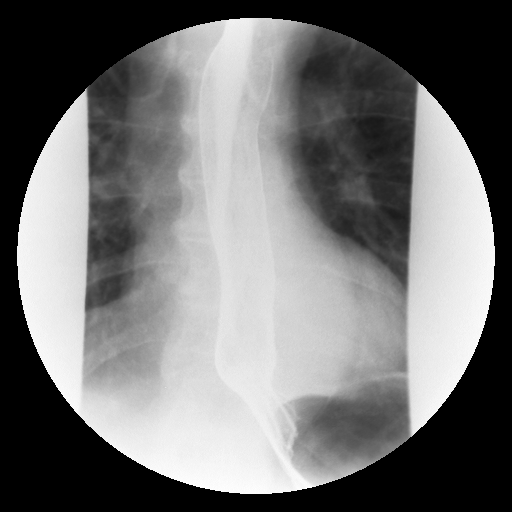

[Series 6: run · 1 of 1 slices shown (5 of 19)]
[im 1/1]
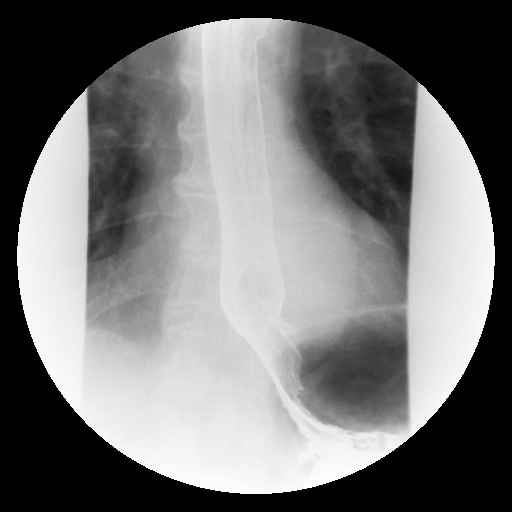

[Series 7: run · 1 of 1 slices shown (6 of 19)]
[im 1/1]
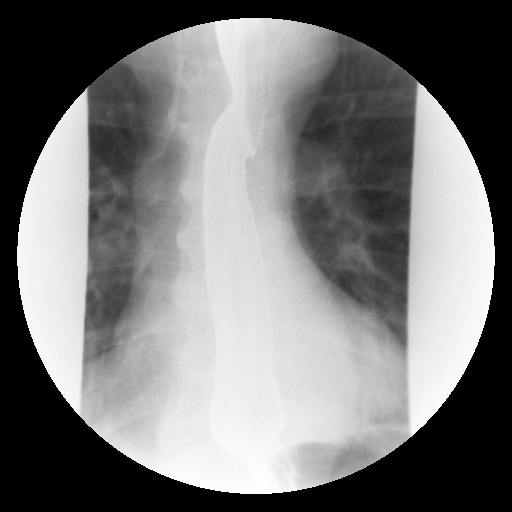

[Series 9: run · 1 of 1 slices shown (7 of 19)]
[im 1/1]
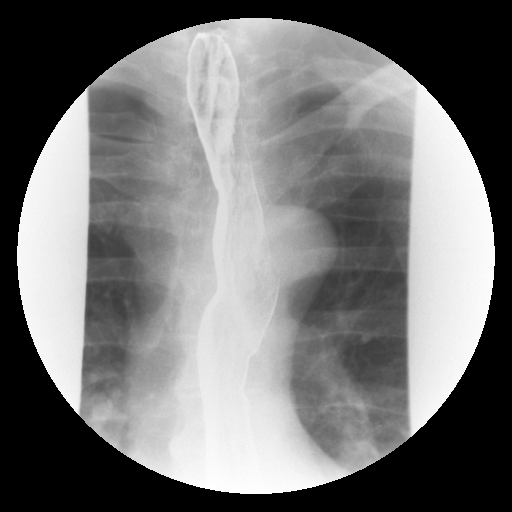

[Series 10: run · 1 of 1 slices shown (8 of 19)]
[im 1/1]
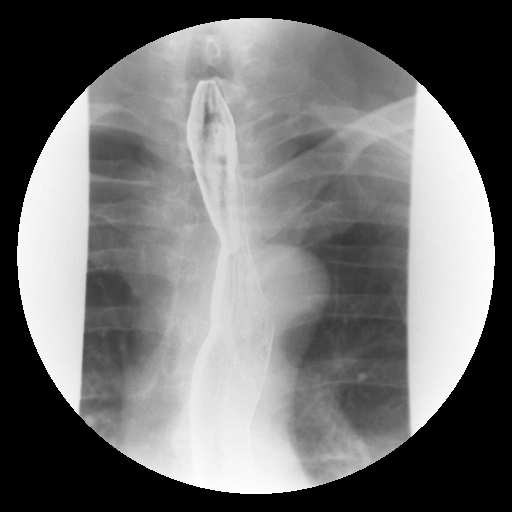

[Series 11: run · 1 of 1 slices shown (9 of 19)]
[im 1/1]
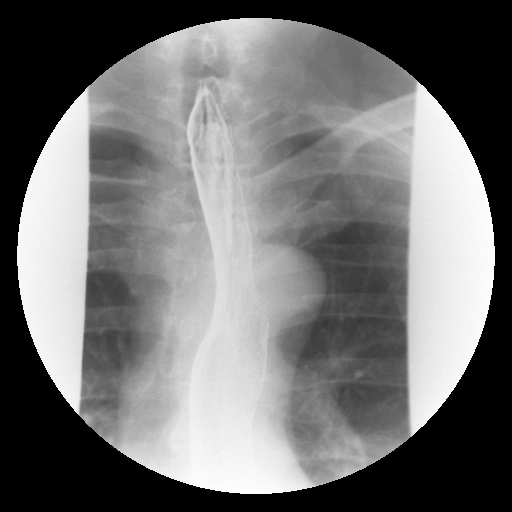

[Series 13: run · 1 of 1 slices shown (10 of 19)]
[im 1/1]
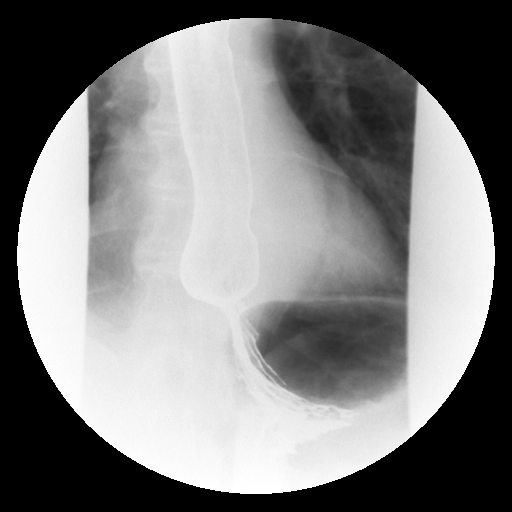

[Series 14: run · 1 of 1 slices shown (11 of 19)]
[im 1/1]
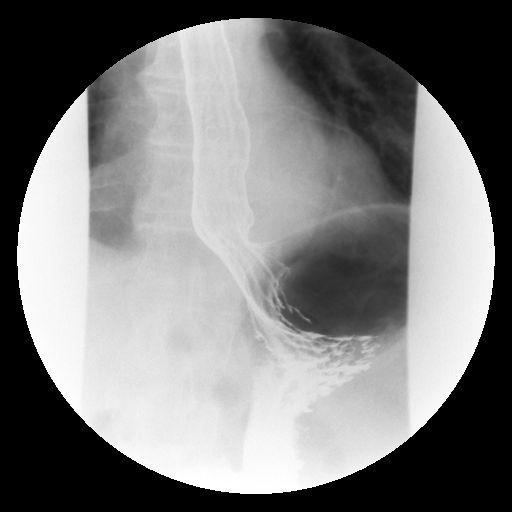

[Series 15: run · 1 of 1 slices shown (12 of 19)]
[im 1/1]
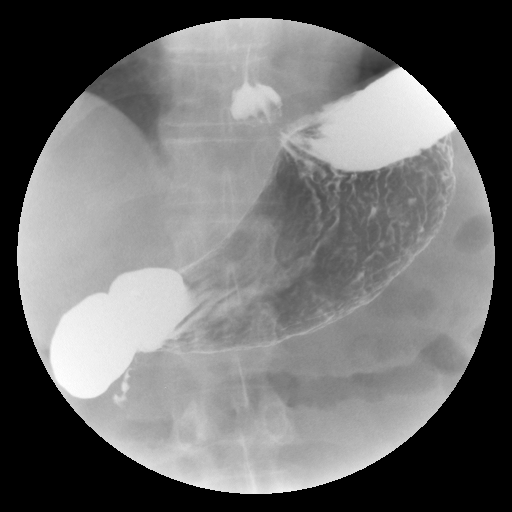

[Series 16: run · 1 of 1 slices shown (13 of 19)]
[im 1/1]
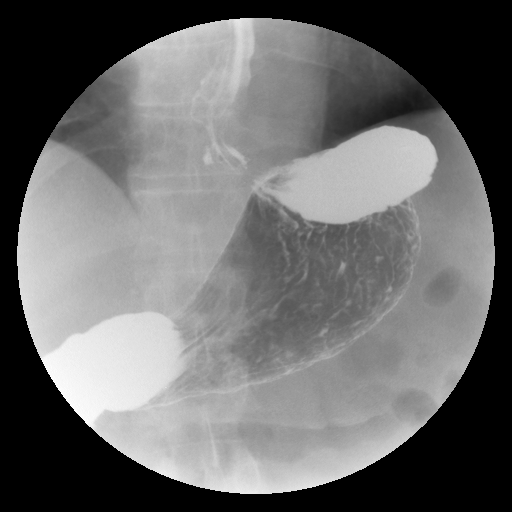

[Series 18: run · 1 of 1 slices shown (14 of 19)]
[im 1/1]
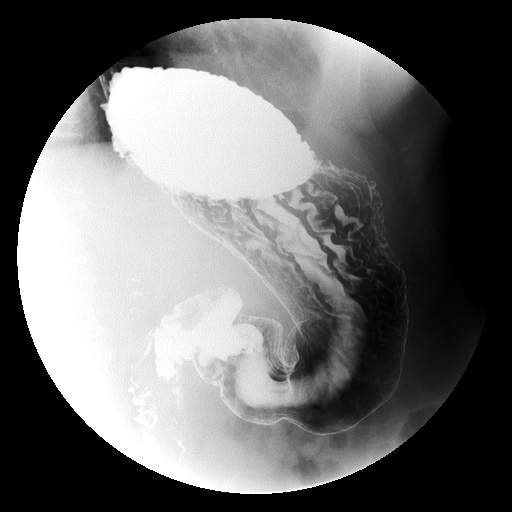

[Series 19: run · 1 of 1 slices shown (15 of 19)]
[im 1/1]
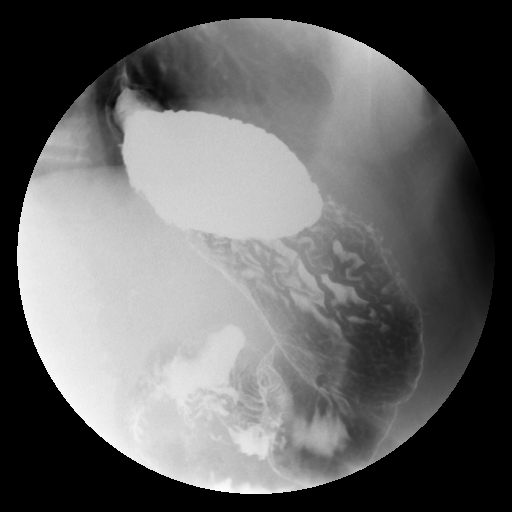

[Series 20: run · 1 of 1 slices shown (16 of 19)]
[im 1/1]
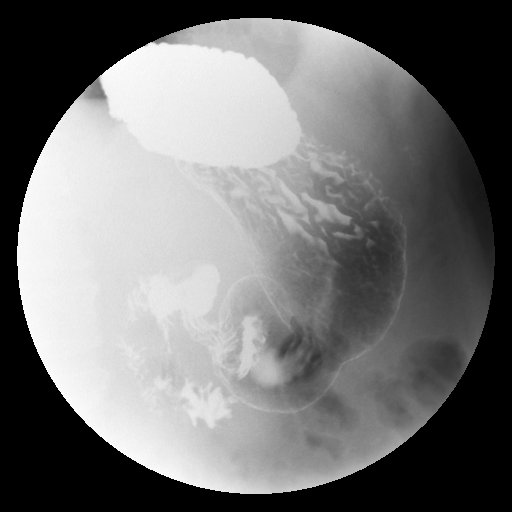

[Series 21: run · 1 of 1 slices shown (17 of 19)]
[im 1/1]
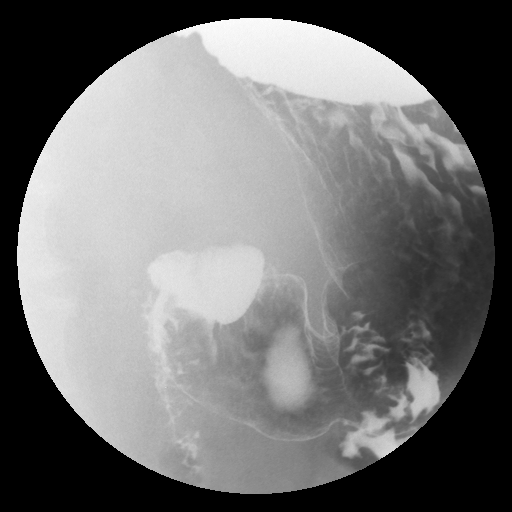

[Series 23: run · 1 of 1 slices shown (18 of 19)]
[im 1/1]
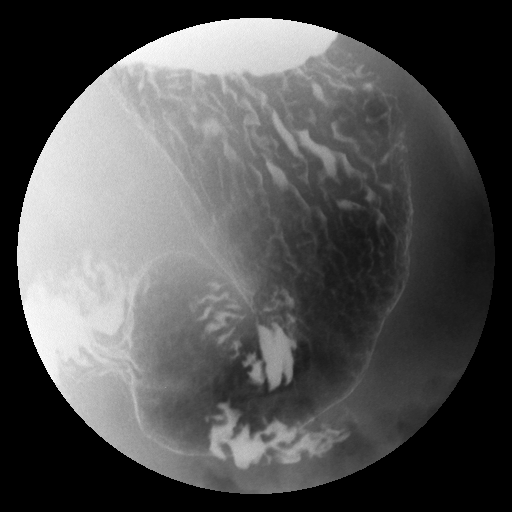

[Series 24: run · 1 of 1 slices shown (19 of 19)]
[im 1/1]
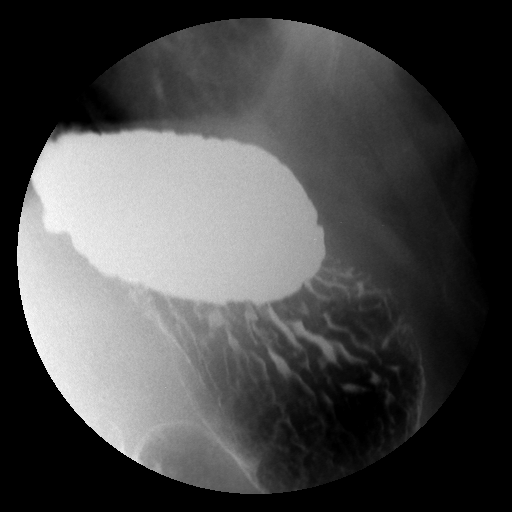

[19 of 24 positions shown; findings below may reference images not displayed]

FINDINGS: Preprocedural scout view demonstrates a normal bowel gas pattern.
Mild dextro convex lumbar scoliosis. Negative lung bases. No acute
osseous abnormality identified.

A double contrast study was undertaken and the patient tolerated
this well and without difficulty.

The thoracic esophagus is somewhat patulous throughout its course.
The thoracic aorta contour is mildly tortuous. No obstruction to the
forward flow of contrast throughout the esophagus and into the
stomach. Normal esophageal course and contour. Normal esophageal
mucosal pattern.

Good gastric coating with barium. Prompt gastric emptying. Gastric
rugal folds and mucosal pattern appear within normal limits
throughout. Gastric antrum and duodenum bulb appear normal. Duodenum
mucosal pattern and C-loop configuration are normal.

Intermittent and generally small gastric hiatal hernia is observed.
Spontaneous gastroesophageal reflux intermittently occurred.

With prone swallows esophageal motility is normal for age.
IMPRESSION: 1. Positive for sliding-type gastric hiatal hernia and spontaneous
gastroesophageal reflux.
2. Patulous esophagus. Esophageal and gastric mucosal pattern
appears normal.
3. Otherwise negative upper GI.

## 2017-09-12 DIAGNOSIS — F411 Generalized anxiety disorder: Secondary | ICD-10-CM | POA: Diagnosis not present

## 2017-09-13 DIAGNOSIS — Z23 Encounter for immunization: Secondary | ICD-10-CM | POA: Diagnosis not present

## 2017-09-13 DIAGNOSIS — F419 Anxiety disorder, unspecified: Secondary | ICD-10-CM | POA: Diagnosis not present

## 2017-09-13 DIAGNOSIS — I10 Essential (primary) hypertension: Secondary | ICD-10-CM | POA: Diagnosis not present

## 2017-10-14 DIAGNOSIS — F411 Generalized anxiety disorder: Secondary | ICD-10-CM | POA: Diagnosis not present

## 2017-10-14 DIAGNOSIS — F4324 Adjustment disorder with disturbance of conduct: Secondary | ICD-10-CM | POA: Diagnosis not present

## 2017-10-31 DIAGNOSIS — R634 Abnormal weight loss: Secondary | ICD-10-CM | POA: Diagnosis not present

## 2017-10-31 DIAGNOSIS — Z85820 Personal history of malignant melanoma of skin: Secondary | ICD-10-CM | POA: Diagnosis not present

## 2017-10-31 DIAGNOSIS — K219 Gastro-esophageal reflux disease without esophagitis: Secondary | ICD-10-CM | POA: Diagnosis not present

## 2017-10-31 DIAGNOSIS — N401 Enlarged prostate with lower urinary tract symptoms: Secondary | ICD-10-CM | POA: Diagnosis not present

## 2017-10-31 DIAGNOSIS — F411 Generalized anxiety disorder: Secondary | ICD-10-CM | POA: Diagnosis not present

## 2017-10-31 DIAGNOSIS — G4709 Other insomnia: Secondary | ICD-10-CM | POA: Diagnosis not present

## 2017-10-31 DIAGNOSIS — R351 Nocturia: Secondary | ICD-10-CM | POA: Diagnosis not present

## 2017-10-31 DIAGNOSIS — C61 Malignant neoplasm of prostate: Secondary | ICD-10-CM | POA: Diagnosis not present

## 2017-10-31 DIAGNOSIS — Z8739 Personal history of other diseases of the musculoskeletal system and connective tissue: Secondary | ICD-10-CM | POA: Diagnosis not present

## 2017-10-31 DIAGNOSIS — I10 Essential (primary) hypertension: Secondary | ICD-10-CM | POA: Diagnosis not present

## 2017-10-31 DIAGNOSIS — K5900 Constipation, unspecified: Secondary | ICD-10-CM | POA: Diagnosis not present

## 2017-11-18 DIAGNOSIS — F411 Generalized anxiety disorder: Secondary | ICD-10-CM | POA: Diagnosis not present

## 2017-11-19 DIAGNOSIS — M545 Low back pain: Secondary | ICD-10-CM | POA: Diagnosis not present

## 2017-11-20 DIAGNOSIS — M6281 Muscle weakness (generalized): Secondary | ICD-10-CM | POA: Diagnosis not present

## 2017-11-20 DIAGNOSIS — M25552 Pain in left hip: Secondary | ICD-10-CM | POA: Diagnosis not present

## 2017-11-20 DIAGNOSIS — S76012D Strain of muscle, fascia and tendon of left hip, subsequent encounter: Secondary | ICD-10-CM | POA: Diagnosis not present

## 2017-11-25 DIAGNOSIS — F411 Generalized anxiety disorder: Secondary | ICD-10-CM | POA: Diagnosis not present

## 2017-12-04 DIAGNOSIS — M6281 Muscle weakness (generalized): Secondary | ICD-10-CM | POA: Diagnosis not present

## 2017-12-04 DIAGNOSIS — M25552 Pain in left hip: Secondary | ICD-10-CM | POA: Diagnosis not present

## 2017-12-04 DIAGNOSIS — S76012D Strain of muscle, fascia and tendon of left hip, subsequent encounter: Secondary | ICD-10-CM | POA: Diagnosis not present

## 2017-12-06 DIAGNOSIS — S76012D Strain of muscle, fascia and tendon of left hip, subsequent encounter: Secondary | ICD-10-CM | POA: Diagnosis not present

## 2017-12-06 DIAGNOSIS — M6281 Muscle weakness (generalized): Secondary | ICD-10-CM | POA: Diagnosis not present

## 2017-12-06 DIAGNOSIS — M25552 Pain in left hip: Secondary | ICD-10-CM | POA: Diagnosis not present

## 2017-12-18 DIAGNOSIS — M6281 Muscle weakness (generalized): Secondary | ICD-10-CM | POA: Diagnosis not present

## 2017-12-18 DIAGNOSIS — M25552 Pain in left hip: Secondary | ICD-10-CM | POA: Diagnosis not present

## 2017-12-18 DIAGNOSIS — S76012D Strain of muscle, fascia and tendon of left hip, subsequent encounter: Secondary | ICD-10-CM | POA: Diagnosis not present

## 2018-01-16 DIAGNOSIS — F411 Generalized anxiety disorder: Secondary | ICD-10-CM | POA: Diagnosis not present

## 2018-01-28 DIAGNOSIS — F411 Generalized anxiety disorder: Secondary | ICD-10-CM | POA: Diagnosis not present

## 2018-01-29 DIAGNOSIS — K5900 Constipation, unspecified: Secondary | ICD-10-CM | POA: Diagnosis not present

## 2018-01-29 DIAGNOSIS — K219 Gastro-esophageal reflux disease without esophagitis: Secondary | ICD-10-CM | POA: Diagnosis not present

## 2018-01-29 DIAGNOSIS — F411 Generalized anxiety disorder: Secondary | ICD-10-CM | POA: Diagnosis not present

## 2018-01-29 DIAGNOSIS — C61 Malignant neoplasm of prostate: Secondary | ICD-10-CM | POA: Diagnosis not present

## 2018-01-29 DIAGNOSIS — I10 Essential (primary) hypertension: Secondary | ICD-10-CM | POA: Diagnosis not present

## 2018-01-29 DIAGNOSIS — E538 Deficiency of other specified B group vitamins: Secondary | ICD-10-CM | POA: Diagnosis not present

## 2018-01-29 DIAGNOSIS — G4709 Other insomnia: Secondary | ICD-10-CM | POA: Diagnosis not present

## 2018-02-07 DIAGNOSIS — R0781 Pleurodynia: Secondary | ICD-10-CM | POA: Diagnosis not present

## 2018-02-24 DIAGNOSIS — D485 Neoplasm of uncertain behavior of skin: Secondary | ICD-10-CM | POA: Diagnosis not present

## 2018-02-24 DIAGNOSIS — D224 Melanocytic nevi of scalp and neck: Secondary | ICD-10-CM | POA: Diagnosis not present

## 2018-02-24 DIAGNOSIS — L821 Other seborrheic keratosis: Secondary | ICD-10-CM | POA: Diagnosis not present

## 2018-02-24 DIAGNOSIS — L814 Other melanin hyperpigmentation: Secondary | ICD-10-CM | POA: Diagnosis not present

## 2018-02-24 DIAGNOSIS — Z85820 Personal history of malignant melanoma of skin: Secondary | ICD-10-CM | POA: Diagnosis not present

## 2018-02-24 DIAGNOSIS — Z85828 Personal history of other malignant neoplasm of skin: Secondary | ICD-10-CM | POA: Diagnosis not present

## 2018-02-24 DIAGNOSIS — D1801 Hemangioma of skin and subcutaneous tissue: Secondary | ICD-10-CM | POA: Diagnosis not present

## 2018-02-26 DIAGNOSIS — N400 Enlarged prostate without lower urinary tract symptoms: Secondary | ICD-10-CM | POA: Diagnosis not present

## 2018-02-26 DIAGNOSIS — C61 Malignant neoplasm of prostate: Secondary | ICD-10-CM | POA: Diagnosis not present

## 2018-03-04 DIAGNOSIS — F411 Generalized anxiety disorder: Secondary | ICD-10-CM | POA: Diagnosis not present

## 2018-06-03 DIAGNOSIS — C61 Malignant neoplasm of prostate: Secondary | ICD-10-CM | POA: Diagnosis not present

## 2018-06-03 DIAGNOSIS — Z13220 Encounter for screening for lipoid disorders: Secondary | ICD-10-CM | POA: Diagnosis not present

## 2018-06-03 DIAGNOSIS — K219 Gastro-esophageal reflux disease without esophagitis: Secondary | ICD-10-CM | POA: Diagnosis not present

## 2018-06-03 DIAGNOSIS — E538 Deficiency of other specified B group vitamins: Secondary | ICD-10-CM | POA: Diagnosis not present

## 2018-06-03 DIAGNOSIS — F411 Generalized anxiety disorder: Secondary | ICD-10-CM | POA: Diagnosis not present

## 2018-06-03 DIAGNOSIS — I10 Essential (primary) hypertension: Secondary | ICD-10-CM | POA: Diagnosis not present

## 2018-06-06 DIAGNOSIS — F411 Generalized anxiety disorder: Secondary | ICD-10-CM | POA: Diagnosis not present

## 2018-07-03 DIAGNOSIS — F411 Generalized anxiety disorder: Secondary | ICD-10-CM | POA: Diagnosis not present

## 2018-08-07 DIAGNOSIS — N400 Enlarged prostate without lower urinary tract symptoms: Secondary | ICD-10-CM | POA: Diagnosis not present

## 2018-08-07 DIAGNOSIS — C61 Malignant neoplasm of prostate: Secondary | ICD-10-CM | POA: Diagnosis not present

## 2018-08-17 DIAGNOSIS — T148XXA Other injury of unspecified body region, initial encounter: Secondary | ICD-10-CM | POA: Diagnosis not present

## 2018-08-20 DIAGNOSIS — H5213 Myopia, bilateral: Secondary | ICD-10-CM | POA: Diagnosis not present

## 2018-08-20 DIAGNOSIS — H2513 Age-related nuclear cataract, bilateral: Secondary | ICD-10-CM | POA: Diagnosis not present

## 2018-08-22 DIAGNOSIS — F411 Generalized anxiety disorder: Secondary | ICD-10-CM | POA: Diagnosis not present

## 2018-08-28 DIAGNOSIS — F411 Generalized anxiety disorder: Secondary | ICD-10-CM | POA: Diagnosis not present

## 2018-09-01 DIAGNOSIS — F411 Generalized anxiety disorder: Secondary | ICD-10-CM | POA: Diagnosis not present

## 2018-09-16 DIAGNOSIS — I10 Essential (primary) hypertension: Secondary | ICD-10-CM | POA: Diagnosis not present

## 2018-09-16 DIAGNOSIS — F17210 Nicotine dependence, cigarettes, uncomplicated: Secondary | ICD-10-CM | POA: Diagnosis not present

## 2018-09-16 DIAGNOSIS — Z8546 Personal history of malignant neoplasm of prostate: Secondary | ICD-10-CM | POA: Diagnosis not present

## 2018-09-16 DIAGNOSIS — E782 Mixed hyperlipidemia: Secondary | ICD-10-CM | POA: Diagnosis not present

## 2018-09-16 DIAGNOSIS — M62838 Other muscle spasm: Secondary | ICD-10-CM | POA: Diagnosis not present

## 2018-09-16 DIAGNOSIS — Z23 Encounter for immunization: Secondary | ICD-10-CM | POA: Diagnosis not present

## 2018-09-16 DIAGNOSIS — K5900 Constipation, unspecified: Secondary | ICD-10-CM | POA: Diagnosis not present

## 2018-09-16 DIAGNOSIS — F411 Generalized anxiety disorder: Secondary | ICD-10-CM | POA: Diagnosis not present

## 2018-09-16 DIAGNOSIS — N529 Male erectile dysfunction, unspecified: Secondary | ICD-10-CM | POA: Diagnosis not present

## 2018-10-15 DIAGNOSIS — I10 Essential (primary) hypertension: Secondary | ICD-10-CM | POA: Diagnosis not present

## 2018-11-25 DIAGNOSIS — I10 Essential (primary) hypertension: Secondary | ICD-10-CM | POA: Diagnosis not present

## 2018-11-25 DIAGNOSIS — F17210 Nicotine dependence, cigarettes, uncomplicated: Secondary | ICD-10-CM | POA: Diagnosis not present

## 2018-11-25 DIAGNOSIS — N529 Male erectile dysfunction, unspecified: Secondary | ICD-10-CM | POA: Diagnosis not present

## 2018-11-25 DIAGNOSIS — F411 Generalized anxiety disorder: Secondary | ICD-10-CM | POA: Diagnosis not present

## 2018-11-25 DIAGNOSIS — E782 Mixed hyperlipidemia: Secondary | ICD-10-CM | POA: Diagnosis not present

## 2018-11-25 DIAGNOSIS — C61 Malignant neoplasm of prostate: Secondary | ICD-10-CM | POA: Diagnosis not present

## 2018-11-25 DIAGNOSIS — F39 Unspecified mood [affective] disorder: Secondary | ICD-10-CM | POA: Diagnosis not present

## 2018-11-25 DIAGNOSIS — Z8546 Personal history of malignant neoplasm of prostate: Secondary | ICD-10-CM | POA: Diagnosis not present

## 2018-11-25 DIAGNOSIS — R972 Elevated prostate specific antigen [PSA]: Secondary | ICD-10-CM | POA: Diagnosis not present

## 2018-12-15 DIAGNOSIS — F411 Generalized anxiety disorder: Secondary | ICD-10-CM | POA: Diagnosis not present

## 2018-12-16 ENCOUNTER — Other Ambulatory Visit: Payer: Self-pay | Admitting: Urology

## 2018-12-16 DIAGNOSIS — C61 Malignant neoplasm of prostate: Secondary | ICD-10-CM

## 2018-12-23 ENCOUNTER — Other Ambulatory Visit: Payer: Medicare Other

## 2019-02-09 ENCOUNTER — Ambulatory Visit: Payer: Medicare Other

## 2019-02-09 ENCOUNTER — Ambulatory Visit
Admission: RE | Admit: 2019-02-09 | Discharge: 2019-02-09 | Disposition: A | Discharge status: Home / Self Care | Payer: Medicare Other | Source: Ambulatory Visit | Attending: Urology | Admitting: Urology

## 2019-02-09 ENCOUNTER — Other Ambulatory Visit: Payer: Self-pay

## 2019-02-09 DIAGNOSIS — C61 Malignant neoplasm of prostate: Secondary | ICD-10-CM

## 2019-02-09 MED ORDER — GADOBENATE DIMEGLUMINE 529 MG/ML IV SOLN
18.0000 mL | Freq: Once | INTRAVENOUS | Status: AC | PRN
  Administered 2019-02-09: 18 mL via INTRAVENOUS

## 2019-08-03 ENCOUNTER — Telehealth: Payer: Self-pay | Admitting: Internal Medicine

## 2019-08-03 DIAGNOSIS — Z86010 Personal history of colonic polyps: Secondary | ICD-10-CM

## 2019-08-03 NOTE — Telephone Encounter (Signed)
Recv'd records from [place] forwarded 18 pages to Dr. Silvano Rusk [facility] [id number]/2/21fbg

## 2019-08-03 NOTE — Telephone Encounter (Signed)
Received mail indicating that his records release form was not [employer] compliant so could not get [facility]  Chart has colonoscopy op note and pathology from 2016 in it so that may be all we need  If he is asking to set up a routine repeat colonoscopy for hx polyps ok to do direct  If he has other sxs and wants office visit that should be ok also and can sort out the records at visit

## 2019-08-03 NOTE — Telephone Encounter (Signed)
Matthew Bailey, can you please help set this gentleman up with an appt direct procedure or OV.  See Dr. Celesta Aver notes

## 2019-08-05 ENCOUNTER — Encounter: Payer: Self-pay | Admitting: Internal Medicine

## 2019-08-05 DIAGNOSIS — Z86010 Personal history of colonic polyps: Secondary | ICD-10-CM | POA: Insufficient documentation

## 2019-08-05 NOTE — Telephone Encounter (Signed)
Hi Dr. Carlean Purl, I wanted to make you aware that we just received pt's records from [place]. Would like to review them? Or should I contact pt? Thank you.

## 2019-08-05 NOTE — Telephone Encounter (Signed)
Please arrange direct colonoscopy for hx of polyps if that is what he desires and is not on blood thinners.  If he wants an office visit that is ok, too. (If has signs and symptoms he wants to discuss)

## 2019-08-06 NOTE — Telephone Encounter (Signed)
Left message to call back  

## 2019-08-18 ENCOUNTER — Encounter: Payer: Self-pay | Admitting: Internal Medicine

## 2019-09-14 ENCOUNTER — Telehealth: Payer: Self-pay | Admitting: Internal Medicine

## 2019-09-14 NOTE — Telephone Encounter (Signed)
Matthew Bailey had left message yesterday. Please just call and r/s him to another provider for a colonoscopy.Thank you. They have a pre-visit set up.

## 2019-09-14 NOTE — Telephone Encounter (Signed)
Patient scheduled for 10/21/19 at 3:30 PM  (Dr.Stark) Informed to arrive at 2:30 PM.. Thanks

## 2019-10-01 ENCOUNTER — Other Ambulatory Visit: Payer: Self-pay

## 2019-10-01 ENCOUNTER — Ambulatory Visit (AMBULATORY_SURGERY_CENTER): Payer: Self-pay | Admitting: *Deleted

## 2019-10-01 VITALS — Ht 71.0 in | Wt 199.0 lb

## 2019-10-01 DIAGNOSIS — Z86010 Personal history of colonic polyps: Secondary | ICD-10-CM

## 2019-10-01 DIAGNOSIS — Z800 Family history of malignant neoplasm of digestive organs: Secondary | ICD-10-CM

## 2019-10-01 MED ORDER — PLENVU 140 G PO SOLR
1.0000 | Freq: Once | ORAL | 0 refills | Status: AC
Start: 2019-10-01 — End: 2019-10-01

## 2019-10-01 NOTE — Progress Notes (Signed)
Completed covid vaccines 02-27-19  Pt is aware that care partner will wait in the car during procedure; if they feel like they will be too hot or cold to wait in the car; they may wait in the 4 th floor lobby. Patient is aware to bring only one care partner. We want them to wear a mask (we do not have any that we can provide them), practice social distancing, and we will check their temperatures when they get here.  I did remind the patient that their care partner needs to stay in the parking lot the entire time and have a cell phone available, we will call them when the pt is ready for discharge. Patient will wear mask into building.   No trouble with anesthesia, difficulty with intubation or hx/fam hx of malignant hyperthermia per pt   No egg or soy allergy  No home oxygen use   No medications for weight loss taken  Pt denies constipation issues  Plenvu coupon given

## 2019-10-21 ENCOUNTER — Encounter: Payer: Self-pay | Admitting: Gastroenterology

## 2019-10-21 ENCOUNTER — Ambulatory Visit (AMBULATORY_SURGERY_CENTER): Payer: Medicare Other | Admitting: Gastroenterology

## 2019-10-21 ENCOUNTER — Encounter: Payer: BLUE CROSS/BLUE SHIELD | Admitting: Internal Medicine

## 2019-10-21 ENCOUNTER — Other Ambulatory Visit: Payer: Self-pay

## 2019-10-21 VITALS — BP 130/72 | HR 48 | Temp 96.8°F | Resp 31 | Ht 71.0 in | Wt 199.0 lb

## 2019-10-21 DIAGNOSIS — D120 Benign neoplasm of cecum: Secondary | ICD-10-CM | POA: Diagnosis not present

## 2019-10-21 DIAGNOSIS — D124 Benign neoplasm of descending colon: Secondary | ICD-10-CM | POA: Diagnosis not present

## 2019-10-21 DIAGNOSIS — D122 Benign neoplasm of ascending colon: Secondary | ICD-10-CM | POA: Diagnosis not present

## 2019-10-21 DIAGNOSIS — Z86010 Personal history of colonic polyps: Secondary | ICD-10-CM | POA: Diagnosis not present

## 2019-10-21 DIAGNOSIS — D123 Benign neoplasm of transverse colon: Secondary | ICD-10-CM

## 2019-10-21 DIAGNOSIS — D125 Benign neoplasm of sigmoid colon: Secondary | ICD-10-CM

## 2019-10-21 DIAGNOSIS — Z860101 Personal history of adenomatous and serrated colon polyps: Secondary | ICD-10-CM

## 2019-10-21 MED ORDER — DICYCLOMINE HCL 10 MG PO CAPS: 10.0000 mg | ORAL_CAPSULE | Freq: Three times a day (TID) | ORAL | 3 refills | Status: DC

## 2019-10-21 MED ORDER — SODIUM CHLORIDE 0.9 % IV SOLN: 500.0000 mL | Freq: Once | INTRAVENOUS | Status: DC

## 2019-10-21 NOTE — Progress Notes (Signed)
A and O x3. Report to RN. Tolerated MAC anesthesia well.

## 2019-10-21 NOTE — Progress Notes (Signed)
Vs by s Monday,RN -adm

## 2019-10-21 NOTE — Patient Instructions (Signed)
Information on hemorrhoids, polyps and diverticulosis given to you today.  Await pathology results.  Resume previous diet and medications.  High fiber diet.   Avoid NSAIDS (Aspirin, Ibuprofen, Aleve, Naproxen), you may use Tylenol as needed for 2 weeks after polyp removal.  YOU HAD AN ENDOSCOPIC PROCEDURE TODAY AT [place]:   Refer to the procedure report that was given to you for any specific questions about what was found during the examination.  If the procedure report does not answer your questions, please call your gastroenterologist to clarify.  If you requested that your care partner not be given the details of your procedure findings, then the procedure report has been included in a sealed envelope for you to review at your convenience later.  YOU SHOULD EXPECT: Some feelings of bloating in the abdomen. Passage of more gas than usual.  Walking can help get rid of the air that was put into your GI tract during the procedure and reduce the bloating. If you had a lower endoscopy (such as a colonoscopy or flexible sigmoidoscopy) you may notice spotting of blood in your stool or on the toilet paper. If you underwent a bowel prep for your procedure, you may not have a normal bowel movement for a few days.  Please Note:  You might notice some irritation and congestion in your nose or some drainage.  This is from the oxygen used during your procedure.  There is no need for concern and it should clear up in a day or so.  SYMPTOMS TO REPORT IMMEDIATELY:   Following lower endoscopy (colonoscopy or flexible sigmoidoscopy):  Excessive amounts of blood in the stool  Significant tenderness or worsening of abdominal pains  Swelling of the abdomen that is new, acute  Fever of 100F or higher   For urgent or emergent issues, a gastroenterologist can be reached at any hour by calling [phone]. Do not use MyChart messaging for urgent concerns.    DIET:  We do recommend a  small meal at first, but then you may proceed to your regular diet.  Drink plenty of fluids but you should avoid alcoholic beverages for 24 hours.  ACTIVITY:  You should plan to take it easy for the rest of today and you should NOT DRIVE or use heavy machinery until tomorrow (because of the sedation medicines used during the test).    FOLLOW UP: Our staff will call the number listed on your records 48-72 hours following your procedure to check on you and address any questions or concerns that you may have regarding the information given to you following your procedure. If we do not reach you, we will leave a message.  We will attempt to reach you two times.  During this call, we will ask if you have developed any symptoms of COVID 19. If you develop any symptoms (ie: fever, flu-like symptoms, shortness of breath, cough etc.) before then, please call [phone].  If you test positive for Covid 19 in the 2 weeks post procedure, please call and report this information to [place].    If any biopsies were taken you will be contacted by phone or by letter within the next 1-3 weeks.  Please call us at [phone] if you have not heard about the biopsies in 3 weeks.    SIGNATURES/CONFIDENTIALITY: You and/or your care partner have signed paperwork which will be entered into your electronic medical record.  These signatures attest to the fact that that the information above  on your After Visit Summary has been reviewed and is understood.  Full responsibility of the confidentiality of this discharge information lies with you and/or your care-partner.

## 2019-10-21 NOTE — Progress Notes (Signed)
Called to room to assist during endoscopic procedure.  Patient ID and intended procedure confirmed with present staff. Received instructions for my participation in the procedure from the performing physician.  

## 2019-10-21 NOTE — Op Note (Addendum)
[place] Patient Name: Matthew Bailey Procedure Date: 10/21/2019 3:07 PM MRN: [id number] Endoscopist: Ladene Artist , MD Age: 74 Referring MD:  Date of Birth: 1945/07/13 Gender: Male Account #: [id number] Procedure:                Colonoscopy Indications:              Surveillance: Personal history of adenomatous                            polyps on last colonoscopy > 5 years ago Medicines:                Monitored Anesthesia Care Procedure:                Pre-Anesthesia Assessment:                           - Prior to the procedure, a History and Physical                            was performed, and patient medications and                            allergies were reviewed. The patient's tolerance of                            previous anesthesia was also reviewed. The risks                            and benefits of the procedure and the sedation                            options and risks were discussed with the patient.                            All questions were answered, and informed consent                            was obtained. Prior Anticoagulants: The patient has                            taken no previous anticoagulant or antiplatelet                            agents. ASA Grade Assessment: II - A patient with                            mild systemic disease. After reviewing the risks                            and benefits, the patient was deemed in                            satisfactory condition to undergo the procedure.  After obtaining informed consent, the colonoscope                            was passed under direct vision. Throughout the                            procedure, the patient's blood pressure, pulse, and                            oxygen saturations were monitored continuously. The                            Colonoscope was introduced through the anus and                            advanced to the the cecum,  identified by                            appendiceal orifice and ileocecal valve. The                            ileocecal valve, appendiceal orifice, and rectum                            were photographed. The quality of the bowel                            preparation was adequate. The colonoscopy was                            performed without difficulty. The patient tolerated                            the procedure well. Scope In: 3:27:45 PM Scope Out: 4:02:37 PM Scope Withdrawal Time: 0 hours 24 minutes 42 seconds  Total Procedure Duration: 0 hours 34 minutes 52 seconds  Findings:                 The perianal and digital rectal examinations were                            normal.                           A 18 mm polyp was found in the cecum. The polyp was                            sessile. The polyp was removed with a hot snare.                            Resection and retrieval were complete.                           Six sessile polyps were found in the descending  colon (2) and transverse colon (4). The polyps were                            6 to 9 mm in size. These polyps were removed with a                            cold snare. Resection and retrieval were complete.                           A 10 mm polyp was found in the sigmoid colon. The                            polyp was pedunculated. The polyp was removed with                            a hot snare. Resection and retrieval were complete.                           Multiple small-mouthed diverticula were found in                            the left colon. There was narrowing of the colon in                            association with the diverticular opening. There                            was evidence of diverticular spasm. There was no                            evidence of diverticular bleeding.                           Internal hemorrhoids were found during                             retroflexion. The hemorrhoids were small and Grade                            I (internal hemorrhoids that do not prolapse).                           The exam was otherwise without abnormality on                            direct and retroflexion views. Complications:            No immediate complications. Estimated blood loss:                            None. Estimated Blood Loss:     Estimated blood loss: none. Impression:               - One 18 mm polyp in the  cecum, removed with a hot                            snare. Resected and retrieved.                           - Six 6 to 9 mm polyps in the descending colon and                            in the transverse colon, removed with a cold snare.                            Resected and retrieved.                           - One 10 mm polyp in the sigmoid colon, removed                            with a hot snare. Resected and retrieved.                           - Moderate diverticulosis in the left colon.                           - Internal hemorrhoids.                           - The examination was otherwise normal on direct                            and retroflexion views. Recommendation:           - Repeat colonoscopy for surveillance based on                            pathology results, likely 2-3 years.                           - Patient has a contact number available for                            emergencies. The signs and symptoms of potential                            delayed complications were discussed with the                            patient. Return to normal activities tomorrow.                            Written discharge instructions were provided to the                            patient.                           -  High fiber diet.                           - Continue present medications.                           - Dicyclomine 10 mg po tid prn abdominal                            pain/bloating, #90, 2  refills.                           - Miralax qd as needed for constipation.                           - Await pathology results.                           - No aspirin, ibuprofen, naproxen, or other                            non-steroidal anti-inflammatory drugs for 2 weeks                            after polyp removal. Ladene Artist, MD 10/21/2019 4:09:32 PM This report has been signed electronically.

## 2019-10-23 ENCOUNTER — Telehealth: Payer: Self-pay

## 2019-10-23 NOTE — Telephone Encounter (Signed)
Left message on answering machine. 

## 2019-10-29 ENCOUNTER — Encounter: Payer: Self-pay | Admitting: Gastroenterology

## 2020-01-12 ENCOUNTER — Other Ambulatory Visit: Payer: Self-pay | Admitting: Gastroenterology

## 2020-01-12 DIAGNOSIS — Z86010 Personal history of colonic polyps: Secondary | ICD-10-CM

## 2020-06-11 ENCOUNTER — Other Ambulatory Visit: Payer: Self-pay | Admitting: Gastroenterology

## 2020-06-11 DIAGNOSIS — Z86010 Personal history of colonic polyps: Secondary | ICD-10-CM

## 2020-10-24 ENCOUNTER — Telehealth: Payer: Self-pay | Admitting: Gastroenterology

## 2020-10-24 NOTE — Telephone Encounter (Signed)
OK with me if the patient prefers to transfer his care to Dr. Carlean Purl.

## 2020-10-24 NOTE — Telephone Encounter (Signed)
Hi Dr. Fuller Plan,   This patient called and said he was originally suppose to have his colonoscopy with Dr. Carlean Purl but due to his accident he was transferred over to you for his colonoscopy procedure to be done. The patient is now asking if he would be able to continue his treatment\care with Dr. Carlean Purl.  Please advise on scheduling.    Thank you

## 2020-10-24 NOTE — Telephone Encounter (Signed)
Hi Dr. Carlean Purl,  Would you be ok with this transfer?

## 2020-10-25 NOTE — Telephone Encounter (Signed)
I am ok with transfer to me

## 2020-10-25 NOTE — Telephone Encounter (Signed)
Called patient to advise left voicemail also changed the recall to reflect this approval.

## 2020-11-07 ENCOUNTER — Other Ambulatory Visit: Payer: Self-pay | Admitting: Gastroenterology

## 2020-11-07 DIAGNOSIS — Z86010 Personal history of colonic polyps: Secondary | ICD-10-CM

## 2021-05-13 ENCOUNTER — Other Ambulatory Visit: Payer: Self-pay | Admitting: Internal Medicine

## 2021-05-13 DIAGNOSIS — Z86010 Personal history of colonic polyps: Secondary | ICD-10-CM

## 2021-06-29 ENCOUNTER — Encounter: Payer: Self-pay | Admitting: Internal Medicine

## 2021-06-29 ENCOUNTER — Ambulatory Visit (INDEPENDENT_AMBULATORY_CARE_PROVIDER_SITE_OTHER): Payer: Medicare Other | Admitting: Internal Medicine

## 2021-06-29 VITALS — BP 110/64 | HR 64 | Ht 71.5 in | Wt 195.0 lb

## 2021-06-29 DIAGNOSIS — Z800 Family history of malignant neoplasm of digestive organs: Secondary | ICD-10-CM

## 2021-06-29 DIAGNOSIS — K5909 Other constipation: Secondary | ICD-10-CM | POA: Diagnosis not present

## 2021-06-29 DIAGNOSIS — Z86010 Personal history of colonic polyps: Secondary | ICD-10-CM

## 2021-06-29 DIAGNOSIS — K581 Irritable bowel syndrome with constipation: Secondary | ICD-10-CM

## 2021-06-29 DIAGNOSIS — Z860101 Personal history of adenomatous and serrated colon polyps: Secondary | ICD-10-CM

## 2021-06-29 NOTE — Patient Instructions (Addendum)
We will put you in the system for a colonoscopy recall for September 2023.  Try 2 Kiwi daily for constipation and may also use miralax - titrate for effect.  Try to come off the dicyclomine and use it as needed.   I appreciate the opportunity to care for you. Silvano Rusk, MD, [facility]

## 2021-06-29 NOTE — Progress Notes (Signed)
Matthew Bailey. 76 y.o. 12-27-1945 [id number]  Assessment & Plan:   Encounter Diagnoses  Name Primary?   Hx of adenomatous colonic polyps Yes   Family history of colon cancer    Other constipation    Irritable bowel syndrome with constipation      Try kiwi fruit two a day or MiraLAX for constipation. He does not seem to have the lower abdominal cramping anymore so may try to stop dicyclomine and use as needed.  Regarding history of polyps and family history of colon cancer.  A 3-year follow-up is appropriate but given the number of polyps the size of the largest and the family history I think repeating 1 at around the 2-year mark is also reasonable and we will plan for that.  Colonoscopy recall entered for September of this year so timing of next routine repeat colonoscopy would be around 2 years.        Subjective:   Chief Complaint: Timing of colonoscopy questions and constipation  HPI 76 year old white man with a family history of colon cancer in his mother, as well as adenomatous polyps previously seen by Dr. Earle Gell and then had colonoscopy with Dr. Lucio Edward (had requested me but I was out with injury).  At that colonoscopy on October 22, 2019 he had 8 polyps maximum 18 mm minimum 6 mm, and they were adenomatous.  He had been having some lower abdominal cramps so dicyclomine was prescribed and he was taking it more frequently but now taking 1 a day but not really having much in the way of cramps.  Describing irregular defecation for a few years not associated with medication or diet or fluid intake changes.  He has been taking Metamucil and some fiber cereal with improvement but was still skip a few days and then have several bowel movements which is a quality-of-life issue to some degree.  He says it is a nuisance.  No bleeding.  No decreased stool caliber.  TFTs normal normal in May.  So was CBC, BMP.  He has prostate cancer and is having observation of that.   This is done through [facility] and he also goes to the [facility] care primary care program with Dr. Wynelle Cleveland.  He is interested given the number and size of polyps and family history of colon cancer and having a colonoscopy sooner than recommended (3-year follow-up was recommended). No Known Allergies Current Meds  Medication Sig   ALPRAZolam (XANAX) 0.5 MG tablet Take 0.5 mg by mouth at bedtime as needed for sleep.    amLODipine (NORVASC) 5 MG tablet daily.   CIALIS 20 MG tablet Take 20 mg by mouth as needed for erectile dysfunction.    citalopram (CELEXA) 40 MG tablet Take 20 mg by mouth daily.    colchicine 0.6 MG tablet Take by mouth as needed.   cyanocobalamin 1000 MCG tablet Take by mouth daily.   dicyclomine (BENTYL) 10 MG capsule TAKE 1 CAPSULE BY MOUTH THREE TIMES A DAY   escitalopram (LEXAPRO) 20 MG tablet Take 1 tablet by mouth daily.   METAMUCIL FIBER PO Take 10 mLs by mouth daily.   omeprazole (PRILOSEC) 20 MG capsule Take 20 mg by mouth daily.   rosuvastatin (CRESTOR) 10 MG tablet Take 10 mg by mouth daily.   tamsulosin (FLOMAX) 0.4 MG CAPS capsule Take by mouth daily.   Past Medical History:  Diagnosis Date   Ankle sprain    Anxiety    Atrial flutter ([place])  Blepharitis    Cervical spinal stenosis    GERD (gastroesophageal reflux disease)    Hx of adenomatous colonic polyps    Hyperlipidemia    Hypertension    Melanoma ([place])    Shingles    Past Surgical History:  Procedure Laterality Date   ANKLE ARTHROSCOPY     APPENDECTOMY     COLONOSCOPY     COLONOSCOPY WITH PROPOFOL N/A 08/17/2014   Procedure: COLONOSCOPY WITH PROPOFOL;  Surgeon: Garlan Fair, MD;  Location: [facility];  Service: Endoscopy;  Laterality: N/A;   INGUINAL HERNIA REPAIR     Scalp surgery      Cancer   Social History   Social History Narrative   Semiretired Network engineer   Swims for exercise does some weights    family history includes Cancer in his sister; Colon cancer in his  mother; Tuberculosis in his mother.   Review of Systems As above  Objective:   Physical Exam '@BP'$  110/64   Pulse 64   Ht 5' 11.5" (1.816 m)   Wt 195 lb (88.5 kg)   BMI 26.82 kg/m @  General:  NAD Eyes:   anicteric Lungs:  clear Heart::  S1S2 no rubs, murmurs or gallops Abdomen:  soft and nontender, BS+ Ext:   no edema, cyanosis or clubbing    Data Reviewed:  See above

## 2021-10-17 ENCOUNTER — Encounter: Payer: Self-pay | Admitting: Internal Medicine

## 2021-12-06 ENCOUNTER — Encounter: Payer: Medicare Other | Admitting: Internal Medicine

## 2021-12-07 ENCOUNTER — Ambulatory Visit (AMBULATORY_SURGERY_CENTER): Payer: Medicare Other | Admitting: *Deleted

## 2021-12-07 ENCOUNTER — Telehealth: Payer: Self-pay | Admitting: *Deleted

## 2021-12-07 VITALS — Ht 71.5 in | Wt 193.0 lb

## 2021-12-07 DIAGNOSIS — Z86010 Personal history of colonic polyps: Secondary | ICD-10-CM

## 2021-12-07 DIAGNOSIS — Z800 Family history of malignant neoplasm of digestive organs: Secondary | ICD-10-CM

## 2021-12-07 MED ORDER — NA SULFATE-K SULFATE-MG SULF 17.5-3.13-1.6 GM/177ML PO SOLN: 1.0000 | Freq: Once | ORAL | 0 refills | Status: AC

## 2021-12-07 NOTE — Progress Notes (Signed)
Pt's previsit is done over the phone and all paperwork (prep instructions, blank consent form to just read over) sent to patient.  Pt's name and DOB verified at the beginning of the previsit.  Pt denies any difficulty with ambulating.    No egg or soy allergy known to patient  No issues known to pt with past sedation with any surgeries or procedures Patient denies ever being told they had issues or difficulty with intubation  No FH of Malignant Hyperthermia Pt is not on diet pills Pt is not on  home 02  Pt is not on blood thinners  Pt denies issues with constipation  Pt encouraged to use to use Singlecare or Goodrx to reduce cost  Patient's chart reviewed by Osvaldo Angst CNRA prior to previsit and patient appropriate for the [place].  Previsit completed and red dot placed by patient's name on their procedure day (on provider's schedule).

## 2021-12-07 NOTE — Telephone Encounter (Signed)
904- attempted to reach pt for virtual PV.  LMOM that I would call back in a few minutes  911- attempted to reach pt again.  LMOM to call back to reschedule PV- can even be done this afternoon if he'd like.  Made aware he does need to call back and reschedule by 5:00 pm or procedure will be cancelled

## 2021-12-07 NOTE — Telephone Encounter (Signed)
Pt called back.  PV will be done today at 3:30 pm

## 2021-12-28 ENCOUNTER — Encounter: Payer: Medicare Other | Admitting: Internal Medicine

## 2022-01-09 ENCOUNTER — Ambulatory Visit (AMBULATORY_SURGERY_CENTER): Payer: Medicare Other | Admitting: Internal Medicine

## 2022-01-09 ENCOUNTER — Encounter: Payer: Self-pay | Admitting: Internal Medicine

## 2022-01-09 VITALS — BP 95/56 | HR 67 | Temp 98.2°F | Resp 20 | Ht 71.5 in | Wt 193.0 lb

## 2022-01-09 DIAGNOSIS — D125 Benign neoplasm of sigmoid colon: Secondary | ICD-10-CM | POA: Diagnosis not present

## 2022-01-09 DIAGNOSIS — D124 Benign neoplasm of descending colon: Secondary | ICD-10-CM

## 2022-01-09 DIAGNOSIS — D122 Benign neoplasm of ascending colon: Secondary | ICD-10-CM

## 2022-01-09 DIAGNOSIS — D120 Benign neoplasm of cecum: Secondary | ICD-10-CM

## 2022-01-09 DIAGNOSIS — Z09 Encounter for follow-up examination after completed treatment for conditions other than malignant neoplasm: Secondary | ICD-10-CM | POA: Diagnosis not present

## 2022-01-09 DIAGNOSIS — Z800 Family history of malignant neoplasm of digestive organs: Secondary | ICD-10-CM

## 2022-01-09 DIAGNOSIS — Z86010 Personal history of colonic polyps: Secondary | ICD-10-CM | POA: Diagnosis not present

## 2022-01-09 MED ORDER — SODIUM CHLORIDE 0.9 % IV SOLN: 500.0000 mL | INTRAVENOUS | Status: DC

## 2022-01-09 NOTE — Progress Notes (Signed)
I have reviewed the patient's medical history in detail and updated the computerized patient record.

## 2022-01-09 NOTE — Progress Notes (Signed)
Sedate, gd SR, tolerated procedure well, VSS, report to RN 

## 2022-01-09 NOTE — Patient Instructions (Addendum)
Six polyps found and removed today.  I will let you know pathology results and when to have another routine colonoscopy by mail and/or My Chart.  You also have a condition called diverticulosis - common and not usually a problem. Please read the handout provided.  I appreciate the opportunity to care for you. Gatha Mayer, MD, [facility]  Patient has a contact number available for emergencies. The signs and symptoms of potential delayed complications were discussed with the patient. Return to normal activities tomorrow. Written discharge instructions were provided to the patient. - Continue present medications. - Await pathology results   YOU HAD AN ENDOSCOPIC PROCEDURE TODAY AT [facility]:   Refer to the procedure report that was given to you for any specific questions about what was found during the examination.  If the procedure report does not answer your questions, please call your gastroenterologist to clarify.  If you requested that your care partner not be given the details of your procedure findings, then the procedure report has been included in a sealed envelope for you to review at your convenience later.  YOU SHOULD EXPECT: Some feelings of bloating in the abdomen. Passage of more gas than usual.  Walking can help get rid of the air that was put into your GI tract during the procedure and reduce the bloating. If you had a lower endoscopy (such as a colonoscopy or flexible sigmoidoscopy) you may notice spotting of blood in your stool or on the toilet paper. If you underwent a bowel prep for your procedure, you may not have a normal bowel movement for a few days.  Please Note:  You might notice some irritation and congestion in your nose or some drainage.  This is from the oxygen used during your procedure.  There is no need for concern and it should clear up in a day or so.  SYMPTOMS TO REPORT IMMEDIATELY:  Following lower endoscopy (colonoscopy or flexible  sigmoidoscopy):  Excessive amounts of blood in the stool  Significant tenderness or worsening of abdominal pains  Swelling of the abdomen that is new, acute  Fever of 100F or higherFor urgent or emergent issues, a gastroenterologist can be reached at any hour by calling [phone]. Do not use MyChart messaging for urgent concerns.    DIET:  We do recommend a small meal at first, but then you may proceed to your regular diet.  Drink plenty of fluids but you should avoid alcoholic beverages for 24 hours.  ACTIVITY:  You should plan to take it easy for the rest of today and you should NOT DRIVE or use heavy machinery until tomorrow (because of the sedation medicines used during the test).    FOLLOW UP: Our staff will call the number listed on your records the next business day following your procedure.  We will call around 7:15- 8:00 am to check on you and address any questions or concerns that you may have regarding the information given to you following your procedure. If we do not reach you, we will leave a message.     If any biopsies were taken you will be contacted by phone or by letter within the next 1-3 weeks.  Please call us at [phone] if you have not heard about the biopsies in 3 weeks.    SIGNATURES/CONFIDENTIALITY: You and/or your care partner have signed paperwork which will be entered into your electronic medical record.  These signatures attest to the fact that that the information  above on your After Visit Summary has been reviewed and is understood.  Full responsibility of the confidentiality of this discharge information lies with you and/or your care-partner.

## 2022-01-09 NOTE — Op Note (Signed)
[place] Patient Name: Matthew Bailey Procedure Date: 01/09/2022 1:36 PM MRN: [id number] Endoscopist: Gatha Mayer , MD, [id number] Age: 77 Referring MD:  Date of Birth: [birth year] Gender: Male Account #: [id number] Procedure:                Colonoscopy Indications:              High risk colon cancer surveillance: Personal                            history of colonic polyps, Last colonoscopy: 2021 Medicines:                Monitored Anesthesia Care Procedure:                Pre-Anesthesia Assessment:                           - Prior to the procedure, a History and Physical                            was performed, and patient medications and                            allergies were reviewed. The patient's tolerance of                            previous anesthesia was also reviewed. The risks                            and benefits of the procedure and the sedation                            options and risks were discussed with the patient.                            All questions were answered, and informed consent                            was obtained. Prior Anticoagulants: The patient has                            taken no anticoagulant or antiplatelet agents. ASA                            Grade Assessment: II - A patient with mild systemic                            disease. After reviewing the risks and benefits,                            the patient was deemed in satisfactory condition to                            undergo the procedure.  After obtaining informed consent, the colonoscope                            was passed under direct vision. Throughout the                            procedure, the patient's blood pressure, pulse, and                            oxygen saturations were monitored continuously. The                            PCF-HQ190L Colonoscope was introduced through the                            anus and advanced  to the the cecum, identified by                            appendiceal orifice and ileocecal valve. The                            colonoscopy was performed without difficulty. The                            patient tolerated the procedure well. The quality                            of the bowel preparation was good. The bowel                            preparation used was SUPREP via split dose                            instruction. The ileocecal valve, appendiceal                            orifice, and rectum were photographed. Scope In: 1:48:18 PM Scope Out: 2:11:42 PM Scope Withdrawal Time: 0 hours 14 minutes 35 seconds  Total Procedure Duration: 0 hours 23 minutes 24 seconds  Findings:                 The perianal and digital rectal examinations were                            normal. Pertinent negatives include normal prostate                            (size, shape, and consistency).                           Six sessile polyps were found in the sigmoid colon,                            descending colon, ascending colon and cecum. The  polyps were 1 to 10 mm in size. These polyps were                            removed with a cold snare. Resection and retrieval                            were complete. Verification of patient                            identification for the specimen was done. Estimated                            blood loss was minimal.                           Multiple diverticula were found in the sigmoid                            colon and descending colon. There was narrowing of                            the colon in association with the diverticular                            opening.                           The exam was otherwise without abnormality on                            direct and retroflexion views. Complications:            No immediate complications. Estimated Blood Loss:     Estimated blood loss was  minimal. Impression:               - Six 1 to 10 mm polyps in the sigmoid colon, in                            the descending colon, in the ascending colon and in                            the cecum, removed with a cold snare. Resected and                            retrieved.                           - Severe diverticulosis in the sigmoid colon and in                            the descending colon. There was narrowing of the                            colon in association with the diverticular opening.                           -  The examination was otherwise normal on direct                            and retroflexion views.                           - Personal history of colonic polyps. 10/2019 - 8                            polyps adenomas max 18 mm and prior polyps also +                            mother w/ FHx CRCA in 60's Recommendation:           - Patient has a contact number available for                            emergencies. The signs and symptoms of potential                            delayed complications were discussed with the                            patient. Return to normal activities tomorrow.                            Written discharge instructions were provided to the                            patient.                           - Resume previous diet.                           - Continue present medications.                           - Await pathology results.                           - Repeat colonoscopy is recommended for                            surveillance. The colonoscopy date will be                            determined after pathology results from today's                            exam become available for review. Gatha Mayer, MD 01/09/2022 2:23:26 PM This report has been signed electronically.

## 2022-01-09 NOTE — Progress Notes (Signed)
[facility] History and Physical   Primary Care Physician:  Burley Saver, MD   Reason for Procedure:   Hx colon polyps  Plan:    colonoscopy     HPI: Matthew Bailey. is a 77 y.o. male here for surveillance colonoscopy exam - hx polyps   Past Medical History:  Diagnosis Date   Ankle sprain    Anxiety    Atrial flutter ([place])    Blepharitis    Cervical spinal stenosis    GERD (gastroesophageal reflux disease)    Hx of adenomatous colonic polyps    Hyperlipidemia    Hypertension    Melanoma ([place])    Shingles     Past Surgical History:  Procedure Laterality Date   ANKLE ARTHROSCOPY     APPENDECTOMY     COLONOSCOPY     COLONOSCOPY WITH PROPOFOL N/A 08/17/2014   Procedure: COLONOSCOPY WITH PROPOFOL;  Surgeon: Garlan Fair, MD;  Location: [facility];  Service: Endoscopy;  Laterality: N/A;   INGUINAL HERNIA REPAIR     Scalp surgery      Cancer    Prior to Admission medications   Medication Sig Start Date End Date Taking? Authorizing Provider  ALPRAZolam Duanne Moron) 0.5 MG tablet Take 0.5 mg by mouth at bedtime as needed for sleep.    Yes [provider]  amLODipine (NORVASC) 5 MG tablet daily. 09/04/17  Yes [provider]  CIALIS 20 MG tablet Take 20 mg by mouth as needed for erectile dysfunction.  06/24/14  Yes [provider]  cyanocobalamin 1000 MCG tablet Take by mouth daily.   Yes [provider]  escitalopram (LEXAPRO) 20 MG tablet Take 1 tablet by mouth daily. 10/15/18  Yes [provider]  olmesartan (BENICAR) 20 MG tablet Take 20 mg by mouth daily. 11/25/21  Yes [provider]  omeprazole (PRILOSEC) 20 MG capsule Take 20 mg by mouth daily.   Yes [provider]  rosuvastatin (CRESTOR) 10 MG tablet Take 10 mg by mouth daily. 09/08/19  Yes [provider]  tamsulosin (FLOMAX) 0.4 MG CAPS capsule Take by mouth daily. 09/10/17  Yes [provider]  colchicine 0.6 MG tablet  Take by mouth as needed. 09/09/17   [provider]  dicyclomine (BENTYL) 10 MG capsule TAKE 1 CAPSULE BY MOUTH THREE TIMES A DAY Patient not taking: Reported on 01/09/2022 05/15/21   Gatha Mayer, MD  METAMUCIL FIBER PO Take 10 mLs by mouth daily. 3-4 times weekly    [provider]    Current Outpatient Medications  Medication Sig Dispense Refill   ALPRAZolam (XANAX) 0.5 MG tablet Take 0.5 mg by mouth at bedtime as needed for sleep.      amLODipine (NORVASC) 5 MG tablet daily.     CIALIS 20 MG tablet Take 20 mg by mouth as needed for erectile dysfunction.   1   cyanocobalamin 1000 MCG tablet Take by mouth daily.     escitalopram (LEXAPRO) 20 MG tablet Take 1 tablet by mouth daily.     olmesartan (BENICAR) 20 MG tablet Take 20 mg by mouth daily.     omeprazole (PRILOSEC) 20 MG capsule Take 20 mg by mouth daily.     rosuvastatin (CRESTOR) 10 MG tablet Take 10 mg by mouth daily.     tamsulosin (FLOMAX) 0.4 MG CAPS capsule Take by mouth daily.     colchicine 0.6 MG tablet Take by mouth as needed.     dicyclomine (BENTYL) 10 MG  capsule TAKE 1 CAPSULE BY MOUTH THREE TIMES A DAY (Patient not taking: Reported on 01/09/2022) 270 capsule 0   METAMUCIL FIBER PO Take 10 mLs by mouth daily. 3-4 times weekly     Current Facility-Administered Medications  Medication Dose Route Frequency Provider Last Rate Last Admin   0.9 %  sodium chloride infusion  500 mL Intravenous Continuous Gatha Mayer, MD        Allergies as of 01/09/2022   (No Known Allergies)    Family History  Problem Relation Age of Onset   Colon cancer Mother        mid 18's diagnosed   Tuberculosis Mother    Cancer Sister    Esophageal cancer Neg Hx    Rectal cancer Neg Hx    Stomach cancer Neg Hx     Social History   Socioeconomic History   Marital status: Married    Spouse name: Not on file   Number of children: Not on file   Years of education: Not on file   Highest education level: Not on file   Occupational History   Not on file  Tobacco Use   Smoking status: Never   Smokeless tobacco: Never  Vaping Use   Vaping Use: Never used  Substance and Sexual Activity   Alcohol use: Yes    Comment: socially   Drug use: No   Sexual activity: Not on file  Other Topics Concern   Not on file  Social History Narrative   Semiretired Network engineer   Swims for exercise does some weights    Social Determinants of Health   Financial Resource Strain: Not on file  Food Insecurity: Not on file  Transportation Needs: Not on file  Physical Activity: Not on file  Stress: Not on file  Social Connections: Not on file  Intimate Partner Violence: Not on file    Review of Systems:  All other review of systems negative except as mentioned in the HPI.  Physical Exam: Vital signs BP 137/80   Pulse 63   Temp 98.2 F (36.8 C) (Temporal)   Ht 5' 11.5" (1.816 m)   Wt 193 lb (87.5 kg)   SpO2 96%   BMI 26.54 kg/m   General:   Alert,  Well-developed, well-nourished, pleasant and cooperative in NAD Lungs:  Clear throughout to auscultation.   Heart:  Regular rate and rhythm; no murmurs, clicks, rubs,  or gallops. Abdomen:  Soft, nontender and nondistended. Normal bowel sounds.   Neuro/Psych:  Alert and cooperative. Normal mood and affect. A and O x 3   '@Matthew Bailey'$  Simonne Maffucci, MD, [facility] [phone] (pager) 01/09/2022 1:40 PM@

## 2022-01-10 ENCOUNTER — Telehealth: Payer: Self-pay | Admitting: *Deleted

## 2022-01-10 NOTE — Telephone Encounter (Signed)
Attempted to call patient for their post-procedure follow-up call. No answer. Left voicemail.   

## 2022-01-23 ENCOUNTER — Encounter: Payer: Self-pay | Admitting: Internal Medicine

## 2022-01-23 DIAGNOSIS — Z86010 Personal history of colonic polyps: Secondary | ICD-10-CM

## 2023-01-18 ENCOUNTER — Other Ambulatory Visit (HOSPITAL_BASED_OUTPATIENT_CLINIC_OR_DEPARTMENT_OTHER): Payer: Self-pay

## 2023-01-22 ENCOUNTER — Other Ambulatory Visit (HOSPITAL_BASED_OUTPATIENT_CLINIC_OR_DEPARTMENT_OTHER): Payer: Self-pay

## 2023-01-23 ENCOUNTER — Other Ambulatory Visit (HOSPITAL_BASED_OUTPATIENT_CLINIC_OR_DEPARTMENT_OTHER): Payer: Self-pay

## 2023-01-23 MED ORDER — TADALAFIL 20 MG PO TABS
20.0000 mg | ORAL_TABLET | Freq: Every day | ORAL | 29 refills | Status: DC
  Filled 2023-02-22: qty 4, 4d supply, fill #0
  Filled 2023-02-27: qty 4, 4d supply, fill #1

## 2023-01-23 MED ORDER — ROSUVASTATIN CALCIUM 10 MG PO TABS
10.0000 mg | ORAL_TABLET | Freq: Every day | ORAL | 3 refills | Status: AC
  Filled 2023-04-02: qty 90, 90d supply, fill #0
  Filled 2023-06-28: qty 90, 90d supply, fill #1
  Filled 2023-09-26: qty 90, 90d supply, fill #2

## 2023-01-23 MED ORDER — COLCHICINE 0.6 MG PO TABS
0.6000 mg | ORAL_TABLET | ORAL | 1 refills | Status: AC
  Filled 2023-02-23: qty 30, 6d supply, fill #0

## 2023-01-23 MED ORDER — ACETAZOLAMIDE 125 MG PO TABS
125.0000 mg | ORAL_TABLET | Freq: Two times a day (BID) | ORAL | 1 refills | Status: AC
  Filled 2023-02-22: qty 10, 5d supply, fill #0

## 2023-01-23 MED ORDER — AMLODIPINE BESYLATE 5 MG PO TABS
5.0000 mg | ORAL_TABLET | Freq: Every day | ORAL | 3 refills | Status: AC
  Filled 2023-05-23: qty 90, 90d supply, fill #0

## 2023-01-23 MED ORDER — SOLIFENACIN SUCCINATE 5 MG PO TABS
5.0000 mg | ORAL_TABLET | Freq: Every day | ORAL | 11 refills | Status: DC
  Filled 2023-02-22 – 2023-02-27 (×2): qty 30, 30d supply, fill #0

## 2023-01-23 MED ORDER — TAMSULOSIN HCL 0.4 MG PO CAPS
ORAL_CAPSULE | Freq: Two times a day (BID) | ORAL | 11 refills | Status: AC
  Filled 2023-02-22 – 2023-03-01 (×7): qty 60, 30d supply, fill #0
  Filled 2023-03-02: qty 60, fill #0
  Filled 2023-03-04 – 2023-03-07 (×4): qty 60, 30d supply, fill #0
  Filled 2023-03-08: qty 180, 90d supply, fill #0
  Filled 2023-03-16 – 2023-03-27 (×2): qty 60, 30d supply, fill #0
  Filled 2023-03-27: qty 180, 90d supply, fill #0
  Filled 2023-06-18: qty 180, 90d supply, fill #1

## 2023-01-23 MED ORDER — GABAPENTIN 300 MG PO CAPS
300.0000 mg | ORAL_CAPSULE | Freq: Two times a day (BID) | ORAL | 3 refills | Status: AC
  Filled 2023-04-18: qty 180, 90d supply, fill #0
  Filled 2023-07-19 – 2023-07-25 (×2): qty 180, 90d supply, fill #1

## 2023-01-23 MED ORDER — OLMESARTAN MEDOXOMIL 20 MG PO TABS
20.0000 mg | ORAL_TABLET | Freq: Every day | ORAL | 3 refills | Status: AC
  Filled 2023-02-22: qty 90, 90d supply, fill #0
  Filled 2023-05-20: qty 90, 90d supply, fill #1
  Filled 2023-08-19: qty 90, 90d supply, fill #2

## 2023-01-23 MED ORDER — ESCITALOPRAM OXALATE 20 MG PO TABS: 20.0000 mg | ORAL_TABLET | Freq: Every day | ORAL | 3 refills | Status: AC

## 2023-02-07 ENCOUNTER — Other Ambulatory Visit (HOSPITAL_BASED_OUTPATIENT_CLINIC_OR_DEPARTMENT_OTHER): Payer: Self-pay

## 2023-02-10 ENCOUNTER — Other Ambulatory Visit: Payer: Self-pay

## 2023-02-10 ENCOUNTER — Emergency Department (HOSPITAL_BASED_OUTPATIENT_CLINIC_OR_DEPARTMENT_OTHER)
Admission: EM | Admit: 2023-02-10 | Discharge: 2023-02-10 | Disposition: A | Discharge status: Home / Self Care | Payer: Medicare Other | Attending: Emergency Medicine | Admitting: Emergency Medicine

## 2023-02-10 ENCOUNTER — Emergency Department (HOSPITAL_BASED_OUTPATIENT_CLINIC_OR_DEPARTMENT_OTHER): Payer: Medicare Other

## 2023-02-10 ENCOUNTER — Emergency Department (HOSPITAL_BASED_OUTPATIENT_CLINIC_OR_DEPARTMENT_OTHER): Payer: Medicare Other | Admitting: Radiology

## 2023-02-10 ENCOUNTER — Encounter (HOSPITAL_BASED_OUTPATIENT_CLINIC_OR_DEPARTMENT_OTHER): Payer: Self-pay

## 2023-02-10 DIAGNOSIS — R2231 Localized swelling, mass and lump, right upper limb: Secondary | ICD-10-CM | POA: Insufficient documentation

## 2023-02-10 DIAGNOSIS — M79621 Pain in right upper arm: Secondary | ICD-10-CM | POA: Diagnosis present

## 2023-02-10 NOTE — Discharge Instructions (Signed)
 We that you have a lipoma follow-up with your dermatologist as scheduled and they can likely biopsy this area to confirm.

## 2023-02-10 NOTE — ED Provider Notes (Signed)
 [place] EMERGENCY DEPARTMENT AT [facility] Provider Note   CSN: [id number] Arrival date & time: 02/10/23  9194     History  Chief Complaint  Patient presents with   Arm Pain    right    Matthew Bailey. is a 78 y.o. male.  Here with pain and not in the right upper arm for the last couple days.  He noticed that after a workout but has not gotten better.  No redness.  Denies any weakness numbness tingling.  No history of blood clots.  Denies any fevers or chills.  Denies any chest pain.  History of anxiety a flutter high cholesterol.  The history is provided by the patient.       Home Medications Prior to Admission medications   Medication Sig Start Date End Date Taking? Authorizing Provider  acetaZOLAMIDE  (DIAMOX ) 125 MG tablet Take 1 tablet (125 mg total) by mouth 2 (two) times daily starting day before ascent, and continue for 4 days after. 09/20/22     ALPRAZolam  (XANAX ) 0.5 MG tablet Take 0.5 mg by mouth at bedtime as needed for sleep.     [provider]  amLODipine  (NORVASC ) 5 MG tablet daily. 09/04/17   [provider]  amLODipine  (NORVASC ) 5 MG tablet Take 1 tablet (5 mg total) by mouth daily. 06/18/22     CIALIS  20 MG tablet Take 20 mg by mouth as needed for erectile dysfunction.  06/24/14   [provider]  colchicine  0.6 MG tablet Take by mouth as needed. 09/09/17   [provider]  colchicine  0.6 MG tablet Take as directed per detailed directions provided by MD. 07/31/22     cyanocobalamin 1000 MCG tablet Take by mouth daily.    [provider]  dicyclomine  (BENTYL ) 10 MG capsule TAKE 1 CAPSULE BY MOUTH THREE TIMES A DAY Patient not taking: Reported on 01/09/2022 05/15/21   Avram Lupita BRAVO, MD  escitalopram  (LEXAPRO ) 20 MG tablet Take 1 tablet by mouth daily. 10/15/18   [provider]  escitalopram  (LEXAPRO ) 20 MG tablet Take 1 tablet (20 mg total) by mouth daily. 04/14/22     gabapentin  (NEURONTIN ) 300 MG capsule  Take 1 capsule (300 mg total) by mouth 2 (two) times daily. 10/27/22     METAMUCIL FIBER PO Take 10 mLs by mouth daily. 3-4 times weekly    [provider]  olmesartan  (BENICAR ) 20 MG tablet Take 20 mg by mouth daily. 11/25/21   [provider]  olmesartan  (BENICAR ) 20 MG tablet Take 1 tablet (20 mg total) by mouth daily. 12/07/22     omeprazole  (PRILOSEC) 20 MG capsule Take 20 mg by mouth daily.    [provider]  rosuvastatin  (CRESTOR ) 10 MG tablet Take 10 mg by mouth daily. 09/08/19   [provider]  rosuvastatin  (CRESTOR ) 10 MG tablet Take 1 tablet (10 mg total) by mouth daily. 01/09/23     solifenacin  (VESICARE ) 5 MG tablet Take 1 tablet (5 mg total) by mouth daily. 10/03/22     tadalafil  (CIALIS ) 20 MG tablet Take 1 tablet (20 mg total) by mouth daily as needed for erectile dysfunction. 06/14/22     tamsulosin  (FLOMAX ) 0.4 MG CAPS capsule Take by mouth daily. 09/10/17   [provider]  tamsulosin  (FLOMAX ) 0.4 MG CAPS capsule Take 1 capsule by mouth 2 (two) times daily 30 minutes after same meal each day. 01/07/23         Allergies    Patient has  no known allergies.    Review of Systems   Review of Systems  Physical Exam Updated Vital Signs BP (!) 144/90 (BP Location: Right Arm)   Pulse 69   Temp (!) 97.2 F (36.2 C)   Resp 18   SpO2 97%  Physical Exam Vitals and nursing note reviewed.  Constitutional:      General: He is not in acute distress.    Appearance: He is well-developed. He is not ill-appearing.  HENT:     Head: Normocephalic and atraumatic.     Nose: Nose normal.  Eyes:     Extraocular Movements: Extraocular movements intact.     Conjunctiva/sclera: Conjunctivae normal.     Pupils: Pupils are equal, round, and reactive to light.  Cardiovascular:     Rate and Rhythm: Normal rate and regular rhythm.     Pulses: Normal pulses.     Heart sounds: No murmur heard. Pulmonary:     Effort: Pulmonary effort is normal. No  respiratory distress.     Breath sounds: Normal breath sounds.  Abdominal:     Palpations: Abdomen is soft.     Tenderness: There is no abdominal tenderness.  Musculoskeletal:        General: Tenderness present. No swelling.     Cervical back: Neck supple.     Comments: Tenderness to region of the right upper humerus where there is a palpable freely mobile soft tissue structure but no overlying cellulitis or redness  Skin:    General: Skin is warm and dry.     Capillary Refill: Capillary refill takes less than 2 seconds.  Neurological:     General: No focal deficit present.     Mental Status: He is alert.     Sensory: No sensory deficit.     Motor: No weakness.  Psychiatric:        Mood and Affect: Mood normal.     ED Results / Procedures / Treatments   Labs (all labs ordered are listed, but only abnormal results are displayed) Labs Reviewed - No data to display  EKG None  Radiology US  Venous Img Upper Right (DVT Study) Result Date: 02/10/2023 CLINICAL DATA:  pain C/o soft palpable lump size of silver dollar on patient's sup-lat upper arm x 10 days. No injury EXAM: RIGHT UPPER EXTREMITY VENOUS DOPPLER ULTRASOUND TECHNIQUE: Gray-scale sonography with graded compression, as well as color Doppler and duplex ultrasound were performed to evaluate the upper extremity deep venous system from the level of the subclavian vein and including the jugular, axillary, basilic, radial, ulnar and upper cephalic vein. Spectral Doppler was utilized to evaluate flow at rest and with distal augmentation maneuvers. COMPARISON:  None Available. FINDINGS: VENOUS Normal compressibility of the RIGHT internal jugular, subclavian, axillary, cephalic, basilic, brachial, radial and ulnar veins. No filling defects to suggest DVT on grayscale or color Doppler imaging. Doppler waveforms show normal direction of venous flow, normal respiratory plasticity and response to augmentation. Limited views of the contralateral  subclavian vein are unremarkable. OTHER No evidence of superficial thrombophlebitis or abnormal fluid collection. Focused ultrasound at the area of palpable concern, along the superolateral upper extremity. Images demonstrating a ill-defined ovoid area, with similar echogenicity as the surrounding subcutaneous tissue. The measured area is approximately 3.3 x 0.7 x 3.8 cm. Limitations: none IMPRESSION: 1. No evidence of DVT or superficial thrombophlebitis within the RIGHT upper extremity. 2. 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 Thom Hall, MD [facility] Electronically Signed   By: Thom Hall M.D.   On: 02/10/2023 09:38    Procedures Procedures    Medications Ordered in ED Medications - No data to display  ED Course/ Medical Decision Making/ A&P                                 Medical Decision Making  Matthew Spofford. is here with pain and a knot in his right humeral area.  Differential diagnosis likely lipoma seems less likely to be DVT.  Could be a cystic structure.  Feels soft and mobile.  Will get an ultrasound to further evaluate.  I have no concern for traumatic process or fracture.  Ultimately this is in the soft tissue part of the skin.  I do not think that this is a bony process.  He does have follow-up with a dermatologist soon and I think that he and likely biopsy this area.  I do not think it is infectious.  He is neurovascular neuromuscular intact.  Will get ultrasound to see if that can help outpatient planning.  Will make sure there is no blood clot.  He has good arterial pulses.  Ultrasound shows no clot but does show findings consistent with a lipoma.  He already has follow-up with dermatology in place and they can confirm this likely with biopsy.  Clinically I suspect that this is a lipoma.  Discharged in good condition.  Understands return precautions.  Discharge.  This chart was  dictated using voice recognition software.  Despite best efforts to proofread,  errors can occur which can change the documentation meaning.         Final Clinical Impression(s) / ED Diagnoses Final diagnoses:  Mass of right upper extremity    Rx / DC Orders ED Discharge Orders     None         Matthew Cornet, DO 02/10/23 9053

## 2023-02-10 NOTE — ED Triage Notes (Signed)
 He reports feeling a "lump" on left up[per arm 10 days ago after having worked out. He is in no distress.

## 2023-02-22 ENCOUNTER — Other Ambulatory Visit: Payer: Self-pay

## 2023-02-22 ENCOUNTER — Other Ambulatory Visit (HOSPITAL_BASED_OUTPATIENT_CLINIC_OR_DEPARTMENT_OTHER): Payer: Self-pay

## 2023-02-23 ENCOUNTER — Other Ambulatory Visit (HOSPITAL_BASED_OUTPATIENT_CLINIC_OR_DEPARTMENT_OTHER): Payer: Self-pay

## 2023-02-25 ENCOUNTER — Other Ambulatory Visit (HOSPITAL_BASED_OUTPATIENT_CLINIC_OR_DEPARTMENT_OTHER): Payer: Self-pay

## 2023-02-26 ENCOUNTER — Other Ambulatory Visit (HOSPITAL_BASED_OUTPATIENT_CLINIC_OR_DEPARTMENT_OTHER): Payer: Self-pay

## 2023-02-27 ENCOUNTER — Other Ambulatory Visit: Payer: Self-pay

## 2023-02-27 ENCOUNTER — Other Ambulatory Visit (HOSPITAL_BASED_OUTPATIENT_CLINIC_OR_DEPARTMENT_OTHER): Payer: Self-pay

## 2023-02-27 MED ORDER — AMLODIPINE BESYLATE 5 MG PO TABS
5.0000 mg | ORAL_TABLET | Freq: Every day | ORAL | 3 refills | Status: AC
Start: 2023-02-27 — End: ?
  Filled 2023-02-27: qty 90, 90d supply, fill #0
  Filled 2023-08-21: qty 90, 90d supply, fill #1
  Filled 2023-11-19: qty 90, 90d supply, fill #2

## 2023-02-27 MED ORDER — OLMESARTAN MEDOXOMIL 20 MG PO TABS
20.0000 mg | ORAL_TABLET | Freq: Every day | ORAL | 3 refills | Status: AC
  Filled 2023-02-27 – 2023-11-16 (×2): qty 90, 90d supply, fill #0

## 2023-02-27 MED ORDER — OMEPRAZOLE 40 MG PO CPDR
40.0000 mg | DELAYED_RELEASE_CAPSULE | Freq: Every day | ORAL | 3 refills | Status: AC
  Filled 2023-02-27: qty 90, 90d supply, fill #0
  Filled 2023-05-23: qty 90, 90d supply, fill #1
  Filled 2023-08-21: qty 90, 90d supply, fill #2
  Filled 2023-11-19: qty 90, 90d supply, fill #3

## 2023-02-27 MED ORDER — ROSUVASTATIN CALCIUM 10 MG PO TABS
10.0000 mg | ORAL_TABLET | Freq: Every day | ORAL | 3 refills | Status: AC
  Filled 2023-02-27 – 2023-12-25 (×2): qty 90, 90d supply, fill #0

## 2023-02-27 MED ORDER — GABAPENTIN 300 MG PO CAPS
300.0000 mg | ORAL_CAPSULE | Freq: Two times a day (BID) | ORAL | 3 refills | Status: AC
  Filled 2023-02-27 – 2023-11-09 (×2): qty 180, 90d supply, fill #0
  Filled 2024-02-01: qty 180, 90d supply, fill #1

## 2023-02-27 MED ORDER — ESCITALOPRAM OXALATE 20 MG PO TABS
20.0000 mg | ORAL_TABLET | Freq: Every day | ORAL | 3 refills | Status: DC
  Filled 2023-02-27: qty 90, 90d supply, fill #0
  Filled 2023-05-23: qty 90, 90d supply, fill #1
  Filled 2023-08-21: qty 90, 90d supply, fill #2
  Filled 2023-11-19: qty 90, 90d supply, fill #3

## 2023-02-27 MED ORDER — ALPRAZOLAM 0.5 MG PO TABS
0.5000 mg | ORAL_TABLET | Freq: Every day | ORAL | 2 refills | Status: DC | PRN
  Filled 2023-02-27: qty 30, 30d supply, fill #0
  Filled 2023-07-24: qty 30, 30d supply, fill #1

## 2023-02-28 ENCOUNTER — Other Ambulatory Visit (HOSPITAL_BASED_OUTPATIENT_CLINIC_OR_DEPARTMENT_OTHER): Payer: Self-pay

## 2023-03-01 ENCOUNTER — Other Ambulatory Visit (HOSPITAL_BASED_OUTPATIENT_CLINIC_OR_DEPARTMENT_OTHER): Payer: Self-pay

## 2023-03-02 ENCOUNTER — Other Ambulatory Visit (HOSPITAL_BASED_OUTPATIENT_CLINIC_OR_DEPARTMENT_OTHER): Payer: Self-pay

## 2023-03-04 ENCOUNTER — Other Ambulatory Visit (HOSPITAL_BASED_OUTPATIENT_CLINIC_OR_DEPARTMENT_OTHER): Payer: Self-pay

## 2023-03-05 ENCOUNTER — Other Ambulatory Visit (HOSPITAL_BASED_OUTPATIENT_CLINIC_OR_DEPARTMENT_OTHER): Payer: Self-pay

## 2023-03-06 ENCOUNTER — Other Ambulatory Visit (HOSPITAL_BASED_OUTPATIENT_CLINIC_OR_DEPARTMENT_OTHER): Payer: Self-pay

## 2023-03-07 ENCOUNTER — Other Ambulatory Visit (HOSPITAL_BASED_OUTPATIENT_CLINIC_OR_DEPARTMENT_OTHER): Payer: Self-pay

## 2023-03-08 ENCOUNTER — Other Ambulatory Visit (HOSPITAL_BASED_OUTPATIENT_CLINIC_OR_DEPARTMENT_OTHER): Payer: Self-pay

## 2023-03-08 ENCOUNTER — Other Ambulatory Visit: Payer: Self-pay

## 2023-03-16 ENCOUNTER — Other Ambulatory Visit (HOSPITAL_BASED_OUTPATIENT_CLINIC_OR_DEPARTMENT_OTHER): Payer: Self-pay

## 2023-03-26 ENCOUNTER — Other Ambulatory Visit (HOSPITAL_BASED_OUTPATIENT_CLINIC_OR_DEPARTMENT_OTHER): Payer: Self-pay

## 2023-03-27 ENCOUNTER — Other Ambulatory Visit: Payer: Self-pay

## 2023-03-27 ENCOUNTER — Other Ambulatory Visit (HOSPITAL_BASED_OUTPATIENT_CLINIC_OR_DEPARTMENT_OTHER): Payer: Self-pay

## 2023-04-02 ENCOUNTER — Other Ambulatory Visit (HOSPITAL_BASED_OUTPATIENT_CLINIC_OR_DEPARTMENT_OTHER): Payer: Self-pay

## 2023-04-18 ENCOUNTER — Other Ambulatory Visit (HOSPITAL_BASED_OUTPATIENT_CLINIC_OR_DEPARTMENT_OTHER): Payer: Self-pay

## 2023-04-27 ENCOUNTER — Other Ambulatory Visit (HOSPITAL_BASED_OUTPATIENT_CLINIC_OR_DEPARTMENT_OTHER): Payer: Self-pay

## 2023-05-23 ENCOUNTER — Other Ambulatory Visit: Payer: Self-pay

## 2023-05-23 ENCOUNTER — Other Ambulatory Visit (HOSPITAL_BASED_OUTPATIENT_CLINIC_OR_DEPARTMENT_OTHER): Payer: Self-pay

## 2023-06-04 ENCOUNTER — Other Ambulatory Visit (HOSPITAL_BASED_OUTPATIENT_CLINIC_OR_DEPARTMENT_OTHER): Payer: Self-pay

## 2023-06-04 MED ORDER — HYDROCORTISONE (PERIANAL) 2.5 % EX CREA
TOPICAL_CREAM | CUTANEOUS | 0 refills | Status: AC
  Filled 2023-06-04: qty 30, 30d supply, fill #0
  Filled 2023-06-15: qty 30, 10d supply, fill #0

## 2023-06-12 ENCOUNTER — Other Ambulatory Visit (HOSPITAL_BASED_OUTPATIENT_CLINIC_OR_DEPARTMENT_OTHER): Payer: Self-pay

## 2023-06-12 MED ORDER — TADALAFIL 20 MG PO TABS: 20.0000 mg | ORAL_TABLET | Freq: Every day | ORAL | 29 refills | Status: AC

## 2023-06-14 ENCOUNTER — Other Ambulatory Visit (HOSPITAL_BASED_OUTPATIENT_CLINIC_OR_DEPARTMENT_OTHER): Payer: Self-pay

## 2023-06-15 ENCOUNTER — Other Ambulatory Visit (HOSPITAL_BASED_OUTPATIENT_CLINIC_OR_DEPARTMENT_OTHER): Payer: Self-pay

## 2023-07-24 ENCOUNTER — Other Ambulatory Visit (HOSPITAL_BASED_OUTPATIENT_CLINIC_OR_DEPARTMENT_OTHER): Payer: Self-pay

## 2023-07-30 ENCOUNTER — Other Ambulatory Visit (HOSPITAL_BASED_OUTPATIENT_CLINIC_OR_DEPARTMENT_OTHER): Payer: Self-pay

## 2023-10-01 ENCOUNTER — Other Ambulatory Visit (HOSPITAL_BASED_OUTPATIENT_CLINIC_OR_DEPARTMENT_OTHER): Payer: Self-pay

## 2023-10-01 MED ORDER — ALPRAZOLAM 0.5 MG PO TABS
0.5000 mg | ORAL_TABLET | Freq: Every day | ORAL | 2 refills | Status: AC | PRN
  Filled 2023-10-01: qty 30, 30d supply, fill #0
  Filled 2023-11-09: qty 30, 30d supply, fill #1
  Filled 2024-01-25: qty 30, 30d supply, fill #2

## 2023-11-09 ENCOUNTER — Other Ambulatory Visit (HOSPITAL_BASED_OUTPATIENT_CLINIC_OR_DEPARTMENT_OTHER): Payer: Self-pay

## 2023-11-16 ENCOUNTER — Other Ambulatory Visit (HOSPITAL_BASED_OUTPATIENT_CLINIC_OR_DEPARTMENT_OTHER): Payer: Self-pay

## 2023-12-11 ENCOUNTER — Emergency Department (HOSPITAL_BASED_OUTPATIENT_CLINIC_OR_DEPARTMENT_OTHER)
Admission: EM | Admit: 2023-12-11 | Discharge: 2023-12-11 | Disposition: A | Discharge status: Home / Self Care | Attending: Emergency Medicine | Admitting: Emergency Medicine

## 2023-12-11 ENCOUNTER — Emergency Department (HOSPITAL_BASED_OUTPATIENT_CLINIC_OR_DEPARTMENT_OTHER)

## 2023-12-11 ENCOUNTER — Emergency Department (HOSPITAL_COMMUNITY)

## 2023-12-11 ENCOUNTER — Encounter (HOSPITAL_BASED_OUTPATIENT_CLINIC_OR_DEPARTMENT_OTHER): Payer: Self-pay

## 2023-12-11 ENCOUNTER — Other Ambulatory Visit: Payer: Self-pay

## 2023-12-11 DIAGNOSIS — R42 Dizziness and giddiness: Secondary | ICD-10-CM | POA: Diagnosis present

## 2023-12-11 DIAGNOSIS — Z79899 Other long term (current) drug therapy: Secondary | ICD-10-CM | POA: Diagnosis not present

## 2023-12-11 DIAGNOSIS — I10 Essential (primary) hypertension: Secondary | ICD-10-CM | POA: Insufficient documentation

## 2023-12-11 DIAGNOSIS — D72819 Decreased white blood cell count, unspecified: Secondary | ICD-10-CM | POA: Insufficient documentation

## 2023-12-11 DIAGNOSIS — R2689 Other abnormalities of gait and mobility: Secondary | ICD-10-CM | POA: Diagnosis not present

## 2023-12-11 LAB — CBC WITH DIFFERENTIAL/PLATELET
Abs Immature Granulocytes: 0.02 K/uL (ref 0.00–0.07)
Basophils Absolute: 0 K/uL (ref 0.0–0.1)
Basophils Relative: 1 %
Eosinophils Absolute: 0.2 K/uL (ref 0.0–0.5)
Eosinophils Relative: 5 %
HCT: 41.9 % (ref 39.0–52.0)
Hemoglobin: 14.4 g/dL (ref 13.0–17.0)
Immature Granulocytes: 1 %
Lymphocytes Relative: 32 %
Lymphs Abs: 1.1 K/uL (ref 0.7–4.0)
MCH: 31.8 pg (ref 26.0–34.0)
MCHC: 34.4 g/dL (ref 30.0–36.0)
MCV: 92.5 fL (ref 80.0–100.0)
Monocytes Absolute: 0.4 K/uL (ref 0.1–1.0)
Monocytes Relative: 11 %
Neutro Abs: 1.8 K/uL (ref 1.7–7.7)
Neutrophils Relative %: 50 %
Platelets: 167 K/uL (ref 150–400)
RBC: 4.53 MIL/uL (ref 4.22–5.81)
RDW: 13.5 % (ref 11.5–15.5)
WBC: 3.5 K/uL — ABNORMAL LOW (ref 4.0–10.5)
nRBC: 0 % (ref 0.0–0.2)

## 2023-12-11 LAB — COMPREHENSIVE METABOLIC PANEL WITH GFR
ALT: 22 U/L (ref 0–44)
AST: 25 U/L (ref 15–41)
Albumin: 4.2 g/dL (ref 3.5–5.0)
Alkaline Phosphatase: 69 U/L (ref 38–126)
Anion gap: 10 (ref 5–15)
BUN: 30 mg/dL — ABNORMAL HIGH (ref 8–23)
CO2: 25 mmol/L (ref 22–32)
Calcium: 9.3 mg/dL (ref 8.9–10.3)
Chloride: 104 mmol/L (ref 98–111)
Creatinine, Ser: 1.3 mg/dL — ABNORMAL HIGH (ref 0.61–1.24)
GFR, Estimated: 57 mL/min — ABNORMAL LOW (ref >60)
Glucose, Bld: 98 mg/dL (ref 70–99)
Potassium: 4.4 mmol/L (ref 3.5–5.1)
Sodium: 140 mmol/L (ref 135–145)
Total Bilirubin: 0.5 mg/dL (ref 0.0–1.2)
Total Protein: 6.2 g/dL — ABNORMAL LOW (ref 6.5–8.1)

## 2023-12-11 NOTE — ED Triage Notes (Signed)
 Pt complaining of dizziness, headaches and nausea that started about 2 1/2 weeks ago. Has been going on intermittently since then. Was diagnosed with right ear labyrinthitis by an urgent care on the 2nd.  Was told if it did not get better to come to the er.

## 2023-12-11 NOTE — ED Provider Notes (Signed)
 [place] EMERGENCY DEPARTMENT AT [facility] Provider Note   CSN: [id number] Arrival date & time: 12/11/23  9378     Patient presents with: Dizziness   Matthew Bailey. is a 78 y.o. male with PMH of atrial flutter, GERD, hyperlipidemia, hypertension who presents with about 2 to 3 weeks of dizziness.He states that about two to three weeks ago he was diagnosed with right ear labyrinthitis.  Presented with episodes of vertigo that he send started after he got up to go to the bathroom.  He states that preceding this he did not have any signs of infection.  He was sent home from urgent care about 3 weeks ago with a prescription for meclizine, steroids, and cefdinir.  He states that he is still taking cefdinir and is finishing up on the meclizine as well.  He has since taken his steroids.  Other than this denies any medication changes.  He states that this dizziness is new.  He also states today that he is having a headache that is just in the frontal region.  He states that he has not had a headache like this previously.  No vision changes.  He he does endorse some balance issues with walking in the sense that he is more aware that he does not feel completely right and is very careful about each step.  He denies any palpitations.  He denies any fevers or chills.    Dizziness      Prior to Admission medications   Medication Sig Start Date End Date Taking? Authorizing Provider  acetaZOLAMIDE  (DIAMOX ) 125 MG tablet Take 1 tablet (125 mg total) by mouth 2 (two) times daily starting day before ascent, and continue for 4 days after. 09/20/22     ALPRAZolam  (XANAX ) 0.5 MG tablet Take 0.5 mg by mouth at bedtime as needed for sleep.     [provider]  ALPRAZolam  (XANAX ) 0.5 MG tablet Take 1 tablet (0.5 mg total) by mouth daily as needed for anxiety 10/01/23     amLODipine  (NORVASC ) 5 MG tablet daily. 09/04/17   [provider]  amLODipine  (NORVASC ) 5 MG tablet Take 1 tablet (5  mg total) by mouth daily. 06/18/22     amLODipine  (NORVASC ) 5 MG tablet Take 1 tablet (5 mg total) by mouth daily. 02/27/23     CIALIS  20 MG tablet Take 20 mg by mouth as needed for erectile dysfunction.  06/24/14   [provider]  colchicine  0.6 MG tablet Take by mouth as needed. 09/09/17   [provider]  colchicine  0.6 MG tablet Take as directed per detailed directions provided by MD. 07/31/22     cyanocobalamin 1000 MCG tablet Take by mouth daily.    [provider]  dicyclomine  (BENTYL ) 10 MG capsule TAKE 1 CAPSULE BY MOUTH THREE TIMES A DAY Patient not taking: Reported on 01/09/2022 05/15/21   Avram Lupita BRAVO, MD  escitalopram  (LEXAPRO ) 20 MG tablet Take 1 tablet by mouth daily. 10/15/18   [provider]  escitalopram  (LEXAPRO ) 20 MG tablet Take 1 tablet (20 mg total) by mouth daily. 04/14/22     escitalopram  (LEXAPRO ) 20 MG tablet Take 1 tablet (20 mg total) by mouth daily. 02/27/23     gabapentin  (NEURONTIN ) 300 MG capsule Take 1 capsule (300 mg total) by mouth 2 (two) times daily. 10/27/22     gabapentin  (NEURONTIN ) 300 MG capsule Take 1 capsule (300 mg total) by mouth 2 (two) times daily. 02/27/23     hydrocortisone  (  ANUSOL -HC) 2.5 % rectal cream Apply topically 3 (three) times daily for 10 days 06/04/23     METAMUCIL FIBER PO Take 10 mLs by mouth daily. 3-4 times weekly    [provider]  olmesartan  (BENICAR ) 20 MG tablet Take 20 mg by mouth daily. 11/25/21   [provider]  olmesartan  (BENICAR ) 20 MG tablet Take 1 tablet (20 mg total) by mouth daily. 12/07/22     olmesartan  (BENICAR ) 20 MG tablet Take 1 tablet (20 mg total) by mouth daily. 02/27/23     omeprazole  (PRILOSEC) 20 MG capsule Take 20 mg by mouth daily.    [provider]  omeprazole  (PRILOSEC) 40 MG capsule Take 1 capsule (40 mg total) by mouth daily. 02/27/23     rosuvastatin  (CRESTOR ) 10 MG tablet Take 10 mg by mouth daily. 09/08/19   [provider]   rosuvastatin  (CRESTOR ) 10 MG tablet Take 1 tablet (10 mg total) by mouth daily. 01/09/23     rosuvastatin  (CRESTOR ) 10 MG tablet Take 1 tablet (10 mg total) by mouth once daily 02/27/23     tadalafil  (CIALIS ) 20 MG tablet Take 1 tablet (20 mg total) by mouth daily as needed for erectile dysfunction. 06/12/23     tamsulosin  (FLOMAX ) 0.4 MG CAPS capsule Take by mouth daily. 09/10/17   [provider]  tamsulosin  (FLOMAX ) 0.4 MG CAPS capsule Take 1 capsule by mouth 2 (two) times daily 30 minutes after same meal each day. 01/07/23       Allergies: Patient has no known allergies.    Review of Systems  Neurological:  Positive for dizziness.  As noted in HPI   Updated Vital Signs BP (!) 148/105   Pulse 60   Temp 97.8 F (36.6 C) (Oral)   Resp 18   Ht 5' 11 (1.803 m)   Wt 88.9 kg   SpO2 96%   BMI 27.34 kg/m   Physical Exam Constitutional:      General: He is not in acute distress.    Appearance: He is not toxic-appearing.  HENT:     Ears:     Comments: Some cloudy fluid in lower half of ear drum bilaterally, but no erythema  Cardiovascular:     Rate and Rhythm: Normal rate and regular rhythm.  Pulmonary:     Effort: Pulmonary effort is normal. No respiratory distress.  Musculoskeletal:     Right lower leg: No edema.     Left lower leg: No edema.  Neurological:     General: No focal deficit present.     Mental Status: He is alert and oriented to person, place, and time.     (all labs ordered are listed, but only abnormal results are displayed) Labs Reviewed - No data to display  EKG: EKG Interpretation Date/Time:  Wednesday December 11 2023 06:31:19 EST Ventricular Rate:  63 PR Interval:  196 QRS Duration:  134 QT Interval:  443 QTC Calculation: 454 R Axis:   -44  Text Interpretation: Sinus rhythm RBBB and LAFB Confirmed by Geroldine Berg ([id number]) on 12/11/2023 6:34:40 AM  Radiology: No results found.   Procedures   Medications Ordered in the ED - No data  to display  Clinical Course as of 12/11/23 0851  Wed Dec 11, 2023  9150 CT Head Wo Contrast [RD]    Clinical Course User Index [RD] D'Mello, Aleks Nawrot, DO  Medical Decision Making Amount and/or Complexity of Data Reviewed Labs: ordered. Radiology: ordered. Decision-making details documented in ED Course.   Differentials include cardiac arrhythmia, benign paroxysmal positional vertigo, vestibular neuritis, orthostatic hypotension, medication induced dizziness, anxiety or panic disorder, TIA, anemia  On physical exam, patient does mildly elevated blood pressure but otherwise vital signs are stable.On physical exam patient does not have any focal neuro deficits with strength and sensation intact. 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 With this being persistent for about three weeks and patient having headache as well, with age, would like to get Ct head to rule out any acute intracranial abnormalities.    Lab work shows elevated creatinine and BUN  at 1.3 and 30 respectively that seems to be elevated from baseline and will need some outpatient follow up. WBC count also noted at 3.5 which was noted a month ago. EKG appears to show RBBB although does not appear to be new per records. 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  Discussed with patient that they could benefit from MRI to rule out any acute causes not seen on CT. Patient was agreeable to this and transfer to [facility]. Did discuss with patient transfer via ambulance and patient understood risks and benefits of driving via POV. Do believe that patient is stable at this time to transfer via POV. Did discuss case with Dr. Simon      Final diagnoses:  None    ED Discharge Orders     None          D'Mello,  Emilly Lavey, DO 12/11/23 9078    Tonia Chew, MD 12/11/23 [phone]

## 2023-12-11 NOTE — ED Notes (Signed)
 Report sent to Lauraine, Consulting Civil Engineer at [facility] ED.

## 2023-12-11 NOTE — ED Provider Notes (Signed)
 The patient was sent over from one of our med centers for further evaluation for dizziness with MRI.  MRI here is negative.  The patient is encouraged to continue his steroids and antibiotics and to continue the Antivert.  Will give him ENT follow-up for reevaluation.  He is discharged with return precautions.   Ula Prentice SAUNDERS, MD 12/11/23 [phone]

## 2023-12-11 NOTE — Discharge Instructions (Addendum)
 You came in because you were having some dizziness. Your CT scan of your head was reassuring but sometimes MRI is able to pick up more recent things. I think you would benefit from getting this scan, which unfortunately we do not have here. Thank you for letting me participate in your care today.

## 2023-12-14 ENCOUNTER — Other Ambulatory Visit (HOSPITAL_BASED_OUTPATIENT_CLINIC_OR_DEPARTMENT_OTHER): Payer: Self-pay

## 2023-12-14 MED ORDER — FLUZONE HIGH-DOSE 0.5 ML IM SUSY
0.5000 mL | PREFILLED_SYRINGE | Freq: Once | INTRAMUSCULAR | 0 refills | Status: AC
  Filled 2023-12-14: qty 0.5, 1d supply, fill #0

## 2023-12-23 ENCOUNTER — Other Ambulatory Visit (HOSPITAL_BASED_OUTPATIENT_CLINIC_OR_DEPARTMENT_OTHER): Payer: Self-pay

## 2023-12-23 MED ORDER — TRINTELLIX 10 MG PO TABS
10.0000 mg | ORAL_TABLET | Freq: Every day | ORAL | 5 refills | Status: DC
  Filled 2023-12-23: qty 30, 30d supply, fill #0
  Filled 2024-01-17: qty 30, 30d supply, fill #1

## 2023-12-23 MED ORDER — TAMSULOSIN HCL 0.4 MG PO CAPS
0.4000 mg | ORAL_CAPSULE | Freq: Every day | ORAL | 11 refills | Status: AC
  Filled 2023-12-23: qty 30, 30d supply, fill #0
  Filled 2024-01-17: qty 30, 30d supply, fill #1

## 2023-12-25 ENCOUNTER — Other Ambulatory Visit: Payer: Self-pay

## 2023-12-25 ENCOUNTER — Other Ambulatory Visit (HOSPITAL_BASED_OUTPATIENT_CLINIC_OR_DEPARTMENT_OTHER): Payer: Self-pay

## 2024-01-17 ENCOUNTER — Other Ambulatory Visit: Payer: Self-pay

## 2024-01-24 ENCOUNTER — Other Ambulatory Visit (HOSPITAL_BASED_OUTPATIENT_CLINIC_OR_DEPARTMENT_OTHER): Payer: Self-pay

## 2024-01-24 ENCOUNTER — Other Ambulatory Visit: Payer: Self-pay

## 2024-01-24 MED ORDER — TRIAMCINOLONE ACETONIDE 0.1 % EX OINT
TOPICAL_OINTMENT | CUTANEOUS | 2 refills | Status: AC
  Filled 2024-01-24: qty 80, 30d supply, fill #0

## 2024-01-25 ENCOUNTER — Other Ambulatory Visit (HOSPITAL_BASED_OUTPATIENT_CLINIC_OR_DEPARTMENT_OTHER): Payer: Self-pay

## 2024-01-28 ENCOUNTER — Other Ambulatory Visit (HOSPITAL_BASED_OUTPATIENT_CLINIC_OR_DEPARTMENT_OTHER): Payer: Self-pay

## 2024-01-28 MED ORDER — ESCITALOPRAM OXALATE 20 MG PO TABS
20.0000 mg | ORAL_TABLET | Freq: Every day | ORAL | 3 refills | Status: AC
  Filled 2024-01-28: qty 90, 90d supply, fill #0

## 2024-02-06 ENCOUNTER — Other Ambulatory Visit (HOSPITAL_BASED_OUTPATIENT_CLINIC_OR_DEPARTMENT_OTHER): Payer: Self-pay
